# Patient Record
Sex: Female | Born: 1935 | Race: White | Hispanic: No | Smoking: Never smoker
Health system: Southern US, Community
[De-identification: ages and names within clinical notes are randomized; demographics above are authoritative.]

## PROBLEM LIST (undated history)

## (undated) DIAGNOSIS — L409 Psoriasis, unspecified: Secondary | ICD-10-CM

## (undated) DIAGNOSIS — Z860100 Personal history of colon polyps, unspecified: Secondary | ICD-10-CM

## (undated) DIAGNOSIS — M8580 Other specified disorders of bone density and structure, unspecified site: Secondary | ICD-10-CM

## (undated) DIAGNOSIS — K649 Unspecified hemorrhoids: Secondary | ICD-10-CM

## (undated) DIAGNOSIS — R350 Frequency of micturition: Secondary | ICD-10-CM

## (undated) DIAGNOSIS — Z8719 Personal history of other diseases of the digestive system: Secondary | ICD-10-CM

## (undated) DIAGNOSIS — D509 Iron deficiency anemia, unspecified: Secondary | ICD-10-CM

## (undated) DIAGNOSIS — C44300 Unspecified malignant neoplasm of skin of unspecified part of face: Secondary | ICD-10-CM

## (undated) DIAGNOSIS — M1990 Unspecified osteoarthritis, unspecified site: Secondary | ICD-10-CM

## (undated) DIAGNOSIS — M2550 Pain in unspecified joint: Secondary | ICD-10-CM

## (undated) DIAGNOSIS — I10 Essential (primary) hypertension: Secondary | ICD-10-CM

## (undated) DIAGNOSIS — Z9289 Personal history of other medical treatment: Secondary | ICD-10-CM

## (undated) DIAGNOSIS — J90 Pleural effusion, not elsewhere classified: Secondary | ICD-10-CM

## (undated) DIAGNOSIS — H269 Unspecified cataract: Secondary | ICD-10-CM

## (undated) DIAGNOSIS — E785 Hyperlipidemia, unspecified: Secondary | ICD-10-CM

## (undated) HISTORY — PX: BREAST BIOPSY: SHX20

## (undated) HISTORY — PX: TONSILLECTOMY: SUR1361

## (undated) HISTORY — PX: SKIN CANCER EXCISION: SHX779

## (undated) HISTORY — PX: VEIN LIGATION AND STRIPPING: SHX2653

## (undated) HISTORY — PX: COLONOSCOPY: SHX174

## (undated) HISTORY — PX: CATARACT EXTRACTION W/ INTRAOCULAR LENS IMPLANT: SHX1309

---

## 1975-12-17 HISTORY — PX: TUBAL LIGATION: SHX77

## 1989-08-16 DIAGNOSIS — D509 Iron deficiency anemia, unspecified: Secondary | ICD-10-CM

## 1989-08-16 DIAGNOSIS — Z9289 Personal history of other medical treatment: Secondary | ICD-10-CM

## 1989-08-16 HISTORY — DX: Iron deficiency anemia, unspecified: D50.9

## 1989-08-16 HISTORY — DX: Personal history of other medical treatment: Z92.89

## 1998-10-30 ENCOUNTER — Ambulatory Visit (HOSPITAL_COMMUNITY): Admission: RE | Admit: 1998-10-30 | Discharge: 1998-10-30 | Payer: Self-pay | Admitting: Obstetrics & Gynecology

## 1998-11-29 ENCOUNTER — Encounter (HOSPITAL_COMMUNITY): Admission: RE | Admit: 1998-11-29 | Discharge: 1999-02-27 | Payer: Self-pay | Admitting: Internal Medicine

## 1998-12-25 ENCOUNTER — Other Ambulatory Visit: Admission: RE | Admit: 1998-12-25 | Discharge: 1998-12-25 | Payer: Self-pay | Admitting: *Deleted

## 1999-10-25 ENCOUNTER — Encounter: Admission: RE | Admit: 1999-10-25 | Discharge: 1999-10-25 | Payer: Self-pay | Admitting: Internal Medicine

## 1999-10-25 ENCOUNTER — Encounter: Payer: Self-pay | Admitting: Internal Medicine

## 2000-03-04 ENCOUNTER — Other Ambulatory Visit: Admission: RE | Admit: 2000-03-04 | Discharge: 2000-03-04 | Payer: Self-pay | Admitting: *Deleted

## 2000-03-26 ENCOUNTER — Encounter: Admission: RE | Admit: 2000-03-26 | Discharge: 2000-03-26 | Payer: Self-pay | Admitting: Internal Medicine

## 2000-03-26 ENCOUNTER — Encounter: Payer: Self-pay | Admitting: Internal Medicine

## 2001-03-30 ENCOUNTER — Encounter: Payer: Self-pay | Admitting: Internal Medicine

## 2001-03-30 ENCOUNTER — Encounter: Admission: RE | Admit: 2001-03-30 | Discharge: 2001-03-30 | Payer: Self-pay | Admitting: Internal Medicine

## 2001-04-14 ENCOUNTER — Other Ambulatory Visit: Admission: RE | Admit: 2001-04-14 | Discharge: 2001-04-14 | Payer: Self-pay | Admitting: *Deleted

## 2001-05-04 ENCOUNTER — Encounter: Admission: RE | Admit: 2001-05-04 | Discharge: 2001-05-04 | Payer: Self-pay | Admitting: Internal Medicine

## 2001-05-04 ENCOUNTER — Encounter: Payer: Self-pay | Admitting: Internal Medicine

## 2002-02-24 ENCOUNTER — Ambulatory Visit (HOSPITAL_COMMUNITY): Admission: RE | Admit: 2002-02-24 | Discharge: 2002-02-24 | Payer: Self-pay | Admitting: Gastroenterology

## 2002-04-01 ENCOUNTER — Encounter: Admission: RE | Admit: 2002-04-01 | Discharge: 2002-04-01 | Payer: Self-pay | Admitting: Internal Medicine

## 2002-04-01 ENCOUNTER — Encounter: Payer: Self-pay | Admitting: Internal Medicine

## 2002-04-20 ENCOUNTER — Other Ambulatory Visit: Admission: RE | Admit: 2002-04-20 | Discharge: 2002-04-20 | Payer: Self-pay | Admitting: *Deleted

## 2003-04-13 ENCOUNTER — Encounter: Payer: Self-pay | Admitting: Internal Medicine

## 2003-04-13 ENCOUNTER — Ambulatory Visit (HOSPITAL_COMMUNITY): Admission: RE | Admit: 2003-04-13 | Discharge: 2003-04-13 | Payer: Self-pay | Admitting: Internal Medicine

## 2003-04-26 ENCOUNTER — Other Ambulatory Visit: Admission: RE | Admit: 2003-04-26 | Discharge: 2003-04-26 | Payer: Self-pay | Admitting: *Deleted

## 2003-12-17 HISTORY — PX: HERNIA REPAIR: SHX51

## 2004-02-29 ENCOUNTER — Ambulatory Visit (HOSPITAL_COMMUNITY): Admission: RE | Admit: 2004-02-29 | Discharge: 2004-02-29 | Payer: Self-pay | Admitting: Specialist

## 2004-03-12 ENCOUNTER — Encounter: Admission: RE | Admit: 2004-03-12 | Discharge: 2004-03-12 | Payer: Self-pay | Admitting: General Surgery

## 2004-03-20 ENCOUNTER — Ambulatory Visit (HOSPITAL_COMMUNITY): Admission: RE | Admit: 2004-03-20 | Discharge: 2004-03-21 | Payer: Self-pay | Admitting: General Surgery

## 2004-05-18 ENCOUNTER — Ambulatory Visit (HOSPITAL_COMMUNITY): Admission: RE | Admit: 2004-05-18 | Discharge: 2004-05-18 | Payer: Self-pay | Admitting: Internal Medicine

## 2004-06-11 ENCOUNTER — Other Ambulatory Visit: Admission: RE | Admit: 2004-06-11 | Discharge: 2004-06-11 | Payer: Self-pay | Admitting: *Deleted

## 2005-05-20 ENCOUNTER — Ambulatory Visit (HOSPITAL_COMMUNITY): Admission: RE | Admit: 2005-05-20 | Discharge: 2005-05-20 | Payer: Self-pay | Admitting: Internal Medicine

## 2006-05-21 ENCOUNTER — Ambulatory Visit (HOSPITAL_COMMUNITY): Admission: RE | Admit: 2006-05-21 | Discharge: 2006-05-21 | Payer: Self-pay | Admitting: Internal Medicine

## 2007-01-15 ENCOUNTER — Encounter: Admission: RE | Admit: 2007-01-15 | Discharge: 2007-01-15 | Payer: Self-pay | Admitting: Orthopedic Surgery

## 2007-05-25 ENCOUNTER — Ambulatory Visit (HOSPITAL_COMMUNITY): Admission: RE | Admit: 2007-05-25 | Discharge: 2007-05-25 | Payer: Self-pay | Admitting: Internal Medicine

## 2008-05-25 ENCOUNTER — Ambulatory Visit (HOSPITAL_COMMUNITY): Admission: RE | Admit: 2008-05-25 | Discharge: 2008-05-25 | Payer: Self-pay | Admitting: Internal Medicine

## 2009-05-29 ENCOUNTER — Ambulatory Visit (HOSPITAL_COMMUNITY): Admission: RE | Admit: 2009-05-29 | Discharge: 2009-05-29 | Payer: Self-pay | Admitting: Internal Medicine

## 2010-06-06 ENCOUNTER — Ambulatory Visit (HOSPITAL_COMMUNITY): Admission: RE | Admit: 2010-06-06 | Discharge: 2010-06-06 | Payer: Self-pay | Admitting: Internal Medicine

## 2010-12-31 ENCOUNTER — Encounter
Admission: RE | Admit: 2010-12-31 | Discharge: 2010-12-31 | Payer: Self-pay | Source: Home / Self Care | Attending: Internal Medicine | Admitting: Internal Medicine

## 2011-05-03 NOTE — Op Note (Signed)
NAME:  Christina Levy, Christina Levy                      ACCOUNT NO.:  [id number]   MEDICAL RECORD NO.:  [id number]                   PATIENT TYPE:  OIB   LOCATION:  5741                                 FACILITY:  [facility]   PHYSICIAN:  Gita Kudo, M.D.              DATE OF BIRTH:  [birth year]   DATE OF PROCEDURE:  03/20/2004  DATE OF DISCHARGE:  03/21/2004                                 OPERATIVE REPORT   PREOPERATIVE DIAGNOSIS:  Right lower abdominal wall hernia.   POSTOPERATIVE DIAGNOSIS:  Right lower abdominal wall hernia, very large  direct.   OPERATIVE PROCEDURE:  Repair right abdominal wall hernia, with Kugel two-  layer mesh.   SURGEON:  Gita Kudo, M.D.   ANESTHESIA:  General.   CLINICAL SUMMARY:  A 76 year old female, had left inguinal hernia repaired  by me several years ago and now has a bulge in her right lower abdomen.  Physical examination disclosed a mass and a CT scan obtained to make sure it  was not intra-abdominal showed a large lateral abdominal-pelvic wall hernia.   OPERATIVE FINDINGS:  The patient had a total blow-out of her right lower  quadrant abdominal wall.  The origin was actually at the internal ring.   OPERATIVE PROCEDURE:  Under satisfactory general endotracheal anesthesia,  having received 1.0 g Ancef preop, the patient's abdomen was prepped and  draped in a standard fashion.  A total of 30 mL of 0.5% Marcaine with  epinephrine was infiltrated throughout the procedure for postop analgesia.  Transverse lower abdominal incision was made and carried down through the  subcu fat.  Immediately a bulge was evident, but it was underneath the  external oblique and therefore the external oblique was opened widely down  to the external ring.  Then gentle finger dissection was used to free the  large herniated sac with encased bowel.  In so doing I was able to gently  free it all around to healthy margins.  The hernia actually consisted of  bowel contents  in a sac that was not entered.  It was easily reducible.  The  fascial defect was a large one that started at the internal ring and  extended medially.  After the margins of the hernia sac were identified, the  distal attachments of the round ligament were divided between clamps and  ties of 3-0 Vicryl.  Then the hernia sac was reduced and finger dissection  used to develop a plane in the preperitoneal space.  The hernia contents  were held away with a moistened gauze while the circular portion of a Kugel  mesh two-piece repair was placed.  It was anchored at Cooper's ligament with  a 0 Prolene suture and then introduced into the preperitoneal space and  deployed.  The moistened gauze was removed and the mesh lay in good  position.  Additional sutures of 0 Prolene were used to tack the mesh to the  undersurface of the abdominal wall.  When completed, the mesh very nicely  filled this space and extended from the pubis out to the internal ring.  Then the floor of the canal was closed over the mesh using a running 0  Prolene suture, which took intermittent bites of the mesh itself.  At the  internal ring, when tied, the ring was closed totally and the ends of the  suture left long.  Then the oval piece of the mesh was used without trimming  it at all.  It was anchored at the internal ring with the previous suture  and tacked around the periphery with several interrupted sutures to the  internal oblique above and the inguinal ligament below.  The wound was then  lavaged with saline and closed in layers with running 2-0 Vicryl for  external  oblique, interrupted 2-0 Vicryl for deep fascia, 3-0 Vicryl for subcu, Steri-  Strips for skin.  Sterile absorbent dressings were then applied and the  patient had an uneventful recovery from anesthesia and to the recovery room  in good condition.                                               Gita Kudo, M.D.    MRL/MEDQ  D:  03/20/2004  T:   03/21/2004  Job:  [id number]   cc:   Thora Lance, M.D.  [address]  [place]  [place] [address]  Fax: [phone]

## 2011-05-03 NOTE — Procedures (Signed)
[facility]  Patient:    Christina Levy, Christina Levy Visit Number: [id number] MRN: [id number]          Service Type: Attending:  Verlin Grills, M.D. Dictated by:   Verlin Grills, M.D. Proc. Date: 02/24/02   CC:         Thora Lance, M.D.   Procedure Report  PROCEDURE:  Screening colonoscopy.  REFERRING PHYSICIAN:  Thora Lance, M.D.  PROCEDURE INDICATION:  Christina Levy is a 77 year old female born 1934-05-17. She has a history of neoplastic but noncancerous colon polyps removed colonoscopically in 1991. Her followup colonoscopy five years ago was normal. She is scheduled today for screening colonoscopy with polypectomy to prevent colon cancer.  ENDOSCOPIST:  Verlin Grills, M.D.  PREMEDICATION:  Versed 10 mg, Demerol 50 mg.  ENDOSCOPE:  Olympus Pediatric colonoscope.  PROCEDURE:  After obtaining informed consent, Ms. Thackston was placed in the left lateral decubitus position. I administered intravenous Demerol and intravenous Versed to achieve conscious sedation for the procedure. The patients blood pressure, oxygen saturation, and cardiac rhythm were monitored throughout the procedure and documented in the medical record.  Anal inspection was normal. Digital rectal exam was normal. The Olympus Pediatric video colonoscope was introduced into the rectum and advanced to the cecum. Colonic preparation for the exam today was excellent.  Rectum:  Normal.  Sigmoid colon and descending colon:  Normal.  Splenic flexure:  Normal.  Transverse colon:  Normal.  Hepatic flexure:  Normal.  Ascending colon:  Normal.  Cecum and ileocecal valve:  Normal.  ASSESSMENT:  Normal proctocolonoscopy to the cecum. No endoscopic evidence for the presence of recurrent colorectal neoplasia.  RECOMMENDATIONS:  Repeat colonoscopy in five years. Dictated by:   Verlin Grills, M.D. Attending:  Verlin Grills, M.D. DD:   02/24/02 TD:  02/24/02 Job: [id number]

## 2011-05-20 ENCOUNTER — Other Ambulatory Visit (HOSPITAL_COMMUNITY): Payer: Self-pay | Admitting: Internal Medicine

## 2011-05-20 DIAGNOSIS — Z1231 Encounter for screening mammogram for malignant neoplasm of breast: Secondary | ICD-10-CM

## 2011-06-05 ENCOUNTER — Other Ambulatory Visit: Payer: Self-pay | Admitting: Internal Medicine

## 2011-06-05 DIAGNOSIS — R609 Edema, unspecified: Secondary | ICD-10-CM

## 2011-06-05 DIAGNOSIS — R52 Pain, unspecified: Secondary | ICD-10-CM

## 2011-06-06 ENCOUNTER — Ambulatory Visit
Admission: RE | Admit: 2011-06-06 | Discharge: 2011-06-06 | Disposition: A | Discharge status: Home / Self Care | Payer: Medicare Other | Source: Ambulatory Visit | Attending: Internal Medicine | Admitting: Internal Medicine

## 2011-06-06 DIAGNOSIS — R52 Pain, unspecified: Secondary | ICD-10-CM

## 2011-06-06 DIAGNOSIS — R609 Edema, unspecified: Secondary | ICD-10-CM

## 2011-06-10 ENCOUNTER — Ambulatory Visit (HOSPITAL_COMMUNITY)
Admission: RE | Admit: 2011-06-10 | Discharge: 2011-06-10 | Disposition: A | Discharge status: Home / Self Care | Payer: Medicare Other | Source: Ambulatory Visit | Attending: Internal Medicine | Admitting: Internal Medicine

## 2011-06-10 DIAGNOSIS — Z1231 Encounter for screening mammogram for malignant neoplasm of breast: Secondary | ICD-10-CM | POA: Insufficient documentation

## 2011-06-12 ENCOUNTER — Other Ambulatory Visit: Payer: Self-pay | Admitting: Internal Medicine

## 2011-06-12 DIAGNOSIS — R928 Other abnormal and inconclusive findings on diagnostic imaging of breast: Secondary | ICD-10-CM

## 2011-06-25 ENCOUNTER — Other Ambulatory Visit: Payer: Self-pay | Admitting: Internal Medicine

## 2011-06-25 ENCOUNTER — Ambulatory Visit
Admission: RE | Admit: 2011-06-25 | Discharge: 2011-06-25 | Disposition: A | Discharge status: Home / Self Care | Payer: Medicare Other | Source: Ambulatory Visit | Attending: Internal Medicine | Admitting: Internal Medicine

## 2011-06-25 ENCOUNTER — Other Ambulatory Visit: Payer: Self-pay | Admitting: Diagnostic Radiology

## 2011-06-25 DIAGNOSIS — R928 Other abnormal and inconclusive findings on diagnostic imaging of breast: Secondary | ICD-10-CM

## 2011-12-18 DIAGNOSIS — M542 Cervicalgia: Secondary | ICD-10-CM | POA: Diagnosis not present

## 2011-12-19 DIAGNOSIS — M542 Cervicalgia: Secondary | ICD-10-CM | POA: Diagnosis not present

## 2011-12-19 DIAGNOSIS — R259 Unspecified abnormal involuntary movements: Secondary | ICD-10-CM | POA: Diagnosis not present

## 2011-12-19 DIAGNOSIS — R51 Headache: Secondary | ICD-10-CM | POA: Diagnosis not present

## 2011-12-20 DIAGNOSIS — M542 Cervicalgia: Secondary | ICD-10-CM | POA: Diagnosis not present

## 2011-12-26 DIAGNOSIS — M19019 Primary osteoarthritis, unspecified shoulder: Secondary | ICD-10-CM | POA: Diagnosis not present

## 2011-12-26 DIAGNOSIS — M25519 Pain in unspecified shoulder: Secondary | ICD-10-CM | POA: Diagnosis not present

## 2012-01-10 DIAGNOSIS — M531 Cervicobrachial syndrome: Secondary | ICD-10-CM | POA: Diagnosis not present

## 2012-01-10 DIAGNOSIS — E785 Hyperlipidemia, unspecified: Secondary | ICD-10-CM | POA: Diagnosis not present

## 2012-01-10 DIAGNOSIS — R7301 Impaired fasting glucose: Secondary | ICD-10-CM | POA: Diagnosis not present

## 2012-01-10 DIAGNOSIS — Z1331 Encounter for screening for depression: Secondary | ICD-10-CM | POA: Diagnosis not present

## 2012-01-10 DIAGNOSIS — I10 Essential (primary) hypertension: Secondary | ICD-10-CM | POA: Diagnosis not present

## 2012-01-13 DIAGNOSIS — Z09 Encounter for follow-up examination after completed treatment for conditions other than malignant neoplasm: Secondary | ICD-10-CM | POA: Diagnosis not present

## 2012-01-13 DIAGNOSIS — Z86010 Personal history of colonic polyps: Secondary | ICD-10-CM | POA: Diagnosis not present

## 2012-02-21 DIAGNOSIS — M531 Cervicobrachial syndrome: Secondary | ICD-10-CM | POA: Diagnosis not present

## 2012-04-06 ENCOUNTER — Other Ambulatory Visit: Payer: Self-pay | Admitting: Internal Medicine

## 2012-04-06 DIAGNOSIS — N6039 Fibrosclerosis of unspecified breast: Secondary | ICD-10-CM

## 2012-04-09 DIAGNOSIS — Z79899 Other long term (current) drug therapy: Secondary | ICD-10-CM | POA: Diagnosis not present

## 2012-04-09 DIAGNOSIS — L408 Other psoriasis: Secondary | ICD-10-CM | POA: Diagnosis not present

## 2012-04-15 ENCOUNTER — Ambulatory Visit
Admission: RE | Admit: 2012-04-15 | Discharge: 2012-04-15 | Disposition: A | Discharge status: Home / Self Care | Payer: Medicare Other | Source: Ambulatory Visit | Attending: Internal Medicine | Admitting: Internal Medicine

## 2012-04-15 DIAGNOSIS — N6489 Other specified disorders of breast: Secondary | ICD-10-CM | POA: Diagnosis not present

## 2012-04-15 DIAGNOSIS — N6039 Fibrosclerosis of unspecified breast: Secondary | ICD-10-CM

## 2012-05-08 DIAGNOSIS — M19019 Primary osteoarthritis, unspecified shoulder: Secondary | ICD-10-CM | POA: Diagnosis not present

## 2012-06-22 ENCOUNTER — Other Ambulatory Visit: Payer: Self-pay | Admitting: Internal Medicine

## 2012-06-22 DIAGNOSIS — N649 Disorder of breast, unspecified: Secondary | ICD-10-CM

## 2012-07-01 ENCOUNTER — Ambulatory Visit
Admission: RE | Admit: 2012-07-01 | Discharge: 2012-07-01 | Disposition: A | Discharge status: Home / Self Care | Payer: Medicare Other | Source: Ambulatory Visit | Attending: Internal Medicine | Admitting: Internal Medicine

## 2012-07-01 DIAGNOSIS — N649 Disorder of breast, unspecified: Secondary | ICD-10-CM

## 2012-07-01 DIAGNOSIS — R928 Other abnormal and inconclusive findings on diagnostic imaging of breast: Secondary | ICD-10-CM | POA: Diagnosis not present

## 2012-07-09 DIAGNOSIS — I10 Essential (primary) hypertension: Secondary | ICD-10-CM | POA: Diagnosis not present

## 2012-07-09 DIAGNOSIS — M531 Cervicobrachial syndrome: Secondary | ICD-10-CM | POA: Diagnosis not present

## 2012-08-08 DIAGNOSIS — M19019 Primary osteoarthritis, unspecified shoulder: Secondary | ICD-10-CM | POA: Diagnosis not present

## 2012-08-08 DIAGNOSIS — M25559 Pain in unspecified hip: Secondary | ICD-10-CM | POA: Diagnosis not present

## 2012-08-08 DIAGNOSIS — M6740 Ganglion, unspecified site: Secondary | ICD-10-CM | POA: Diagnosis not present

## 2012-08-12 DIAGNOSIS — L408 Other psoriasis: Secondary | ICD-10-CM | POA: Diagnosis not present

## 2012-08-12 DIAGNOSIS — L570 Actinic keratosis: Secondary | ICD-10-CM | POA: Diagnosis not present

## 2012-08-12 DIAGNOSIS — L600 Ingrowing nail: Secondary | ICD-10-CM | POA: Diagnosis not present

## 2012-08-12 DIAGNOSIS — D2370 Other benign neoplasm of skin of unspecified lower limb, including hip: Secondary | ICD-10-CM | POA: Diagnosis not present

## 2012-08-12 DIAGNOSIS — Z79899 Other long term (current) drug therapy: Secondary | ICD-10-CM | POA: Diagnosis not present

## 2012-08-20 DIAGNOSIS — M19019 Primary osteoarthritis, unspecified shoulder: Secondary | ICD-10-CM | POA: Diagnosis not present

## 2012-08-27 DIAGNOSIS — M19019 Primary osteoarthritis, unspecified shoulder: Secondary | ICD-10-CM | POA: Diagnosis not present

## 2012-08-31 ENCOUNTER — Other Ambulatory Visit: Payer: Self-pay | Admitting: Orthopedic Surgery

## 2012-09-14 ENCOUNTER — Encounter (HOSPITAL_BASED_OUTPATIENT_CLINIC_OR_DEPARTMENT_OTHER): Payer: Self-pay | Admitting: *Deleted

## 2012-09-14 NOTE — Progress Notes (Signed)
To come in for [id number]  

## 2012-09-15 ENCOUNTER — Encounter (HOSPITAL_BASED_OUTPATIENT_CLINIC_OR_DEPARTMENT_OTHER)
Admission: RE | Admit: 2012-09-15 | Discharge: 2012-09-15 | Disposition: A | Discharge status: Home / Self Care | Payer: Medicare Other | Source: Ambulatory Visit | Attending: Orthopedic Surgery | Admitting: Orthopedic Surgery

## 2012-09-15 DIAGNOSIS — M19019 Primary osteoarthritis, unspecified shoulder: Secondary | ICD-10-CM | POA: Diagnosis not present

## 2012-09-15 DIAGNOSIS — J90 Pleural effusion, not elsewhere classified: Secondary | ICD-10-CM | POA: Diagnosis not present

## 2012-09-15 DIAGNOSIS — Z01812 Encounter for preprocedural laboratory examination: Secondary | ICD-10-CM | POA: Diagnosis not present

## 2012-09-15 DIAGNOSIS — M24119 Other articular cartilage disorders, unspecified shoulder: Secondary | ICD-10-CM | POA: Diagnosis not present

## 2012-09-15 DIAGNOSIS — K449 Diaphragmatic hernia without obstruction or gangrene: Secondary | ICD-10-CM | POA: Diagnosis not present

## 2012-09-15 DIAGNOSIS — R0602 Shortness of breath: Secondary | ICD-10-CM | POA: Diagnosis not present

## 2012-09-15 DIAGNOSIS — Z01810 Encounter for preprocedural cardiovascular examination: Secondary | ICD-10-CM | POA: Diagnosis not present

## 2012-09-15 HISTORY — DX: Pleural effusion, not elsewhere classified: J90

## 2012-09-15 HISTORY — PX: SHOULDER ARTHROSCOPY: SHX128

## 2012-09-15 LAB — BASIC METABOLIC PANEL
BUN: 15 mg/dL (ref 6–23)
Calcium: 9.7 mg/dL (ref 8.4–10.5)
GFR calc non Af Amer: 81 mL/min — ABNORMAL LOW (ref >90)
Glucose, Bld: 98 mg/dL (ref 70–99)
Potassium: 3.7 mEq/L (ref 3.5–5.1)

## 2012-09-16 ENCOUNTER — Encounter (HOSPITAL_COMMUNITY): Payer: Self-pay | Admitting: Adult Health

## 2012-09-16 ENCOUNTER — Encounter (HOSPITAL_BASED_OUTPATIENT_CLINIC_OR_DEPARTMENT_OTHER)
Admission: RE | Disposition: A | Discharge status: Home / Self Care | Payer: Self-pay | Source: Ambulatory Visit | Attending: Orthopedic Surgery

## 2012-09-16 ENCOUNTER — Encounter (HOSPITAL_BASED_OUTPATIENT_CLINIC_OR_DEPARTMENT_OTHER): Payer: Self-pay | Admitting: *Deleted

## 2012-09-16 ENCOUNTER — Ambulatory Visit (HOSPITAL_BASED_OUTPATIENT_CLINIC_OR_DEPARTMENT_OTHER)
Admission: RE | Admit: 2012-09-16 | Discharge: 2012-09-16 | Disposition: A | Discharge status: Home / Self Care | Payer: Medicare Other | Source: Ambulatory Visit | Attending: Orthopedic Surgery | Admitting: Orthopedic Surgery

## 2012-09-16 ENCOUNTER — Encounter (HOSPITAL_BASED_OUTPATIENT_CLINIC_OR_DEPARTMENT_OTHER): Payer: Self-pay | Admitting: Anesthesiology

## 2012-09-16 ENCOUNTER — Emergency Department (HOSPITAL_COMMUNITY)
Admission: EM | Admit: 2012-09-16 | Discharge: 2012-09-17 | Disposition: A | Discharge status: Home / Self Care | Payer: Medicare Other | Source: Home / Self Care | Attending: Emergency Medicine | Admitting: Emergency Medicine

## 2012-09-16 ENCOUNTER — Emergency Department (HOSPITAL_COMMUNITY): Payer: Medicare Other

## 2012-09-16 ENCOUNTER — Ambulatory Visit (HOSPITAL_BASED_OUTPATIENT_CLINIC_OR_DEPARTMENT_OTHER): Payer: Medicare Other | Admitting: Anesthesiology

## 2012-09-16 DIAGNOSIS — J90 Pleural effusion, not elsewhere classified: Secondary | ICD-10-CM | POA: Insufficient documentation

## 2012-09-16 DIAGNOSIS — Z01812 Encounter for preprocedural laboratory examination: Secondary | ICD-10-CM | POA: Diagnosis not present

## 2012-09-16 DIAGNOSIS — M66329 Spontaneous rupture of flexor tendons, unspecified upper arm: Secondary | ICD-10-CM | POA: Diagnosis not present

## 2012-09-16 DIAGNOSIS — S43439A Superior glenoid labrum lesion of unspecified shoulder, initial encounter: Secondary | ICD-10-CM | POA: Diagnosis not present

## 2012-09-16 DIAGNOSIS — R0602 Shortness of breath: Secondary | ICD-10-CM

## 2012-09-16 DIAGNOSIS — G8918 Other acute postprocedural pain: Secondary | ICD-10-CM | POA: Diagnosis not present

## 2012-09-16 DIAGNOSIS — Z880 Allergy status to penicillin: Secondary | ICD-10-CM | POA: Insufficient documentation

## 2012-09-16 DIAGNOSIS — M129 Arthropathy, unspecified: Secondary | ICD-10-CM | POA: Insufficient documentation

## 2012-09-16 DIAGNOSIS — E785 Hyperlipidemia, unspecified: Secondary | ICD-10-CM | POA: Insufficient documentation

## 2012-09-16 DIAGNOSIS — M25519 Pain in unspecified shoulder: Secondary | ICD-10-CM | POA: Diagnosis not present

## 2012-09-16 DIAGNOSIS — K449 Diaphragmatic hernia without obstruction or gangrene: Secondary | ICD-10-CM | POA: Insufficient documentation

## 2012-09-16 DIAGNOSIS — Z01810 Encounter for preprocedural cardiovascular examination: Secondary | ICD-10-CM | POA: Diagnosis not present

## 2012-09-16 DIAGNOSIS — M19019 Primary osteoarthritis, unspecified shoulder: Secondary | ICD-10-CM | POA: Insufficient documentation

## 2012-09-16 DIAGNOSIS — M24119 Other articular cartilage disorders, unspecified shoulder: Secondary | ICD-10-CM | POA: Insufficient documentation

## 2012-09-16 DIAGNOSIS — Z79899 Other long term (current) drug therapy: Secondary | ICD-10-CM | POA: Insufficient documentation

## 2012-09-16 DIAGNOSIS — I10 Essential (primary) hypertension: Secondary | ICD-10-CM | POA: Insufficient documentation

## 2012-09-16 HISTORY — DX: Unspecified osteoarthritis, unspecified site: M19.90

## 2012-09-16 HISTORY — DX: Psoriasis, unspecified: L40.9

## 2012-09-16 HISTORY — DX: Essential (primary) hypertension: I10

## 2012-09-16 HISTORY — DX: Hyperlipidemia, unspecified: E78.5

## 2012-09-16 LAB — BASIC METABOLIC PANEL
CO2: 26 mEq/L (ref 19–32)
Calcium: 9.6 mg/dL (ref 8.4–10.5)
Creatinine, Ser: 0.85 mg/dL (ref 0.50–1.10)

## 2012-09-16 LAB — CBC
MCH: 29.8 pg (ref 26.0–34.0)
MCHC: 33.7 g/dL (ref 30.0–36.0)
MCV: 88.3 fL (ref 78.0–100.0)
Platelets: 309 10*3/uL (ref 150–400)
RBC: 4.97 MIL/uL (ref 3.87–5.11)

## 2012-09-16 SURGERY — SHOULDER ARTHROSCOPY WITH BICEPS TENOTOMY
Anesthesia: General | Site: Shoulder | Laterality: Right | Wound class: Clean

## 2012-09-16 MED ORDER — SUCCINYLCHOLINE CHLORIDE 20 MG/ML IJ SOLN
INTRAMUSCULAR | Status: DC | PRN
  Administered 2012-09-16: 100 mg via INTRAVENOUS

## 2012-09-16 MED ORDER — MOXIFLOXACIN HCL 400 MG PO TABS: 400.0000 mg | ORAL_TABLET | Freq: Every day | ORAL | Status: DC

## 2012-09-16 MED ORDER — MOXIFLOXACIN HCL 400 MG PO TABS
400.0000 mg | ORAL_TABLET | Freq: Once | ORAL | Status: AC
  Administered 2012-09-17: 400 mg via ORAL
  Filled 2012-09-16: qty 1

## 2012-09-16 MED ORDER — BUPIVACAINE-EPINEPHRINE PF 0.5-1:200000 % IJ SOLN
INTRAMUSCULAR | Status: DC | PRN
  Administered 2012-09-16: 30 mL

## 2012-09-16 MED ORDER — FENTANYL CITRATE 0.05 MG/ML IJ SOLN
50.0000 ug | Freq: Once | INTRAMUSCULAR | Status: AC
  Administered 2012-09-16: 50 ug via INTRAVENOUS

## 2012-09-16 MED ORDER — HYDROCODONE-ACETAMINOPHEN 5-500 MG PO TABS: 1.0000 | ORAL_TABLET | Freq: Four times a day (QID) | ORAL | Status: DC | PRN

## 2012-09-16 MED ORDER — SODIUM CHLORIDE 0.9 % IR SOLN
Status: DC | PRN
  Administered 2012-09-16: 6000 mL

## 2012-09-16 MED ORDER — MIDAZOLAM HCL 2 MG/2ML IJ SOLN
1.0000 mg | INTRAMUSCULAR | Status: DC | PRN
  Administered 2012-09-16: 1 mg via INTRAVENOUS

## 2012-09-16 MED ORDER — CEFAZOLIN SODIUM-DEXTROSE 2-3 GM-% IV SOLR
2.0000 g | INTRAVENOUS | Status: AC
  Administered 2012-09-16: 2 g via INTRAVENOUS

## 2012-09-16 MED ORDER — MIDAZOLAM HCL 2 MG/2ML IJ SOLN: 0.5000 mg | Freq: Once | INTRAMUSCULAR | Status: DC | PRN

## 2012-09-16 MED ORDER — PROPOFOL 10 MG/ML IV BOLUS
INTRAVENOUS | Status: DC | PRN
  Administered 2012-09-16: 100 mg via INTRAVENOUS

## 2012-09-16 MED ORDER — HYDROMORPHONE HCL PF 1 MG/ML IJ SOLN: 0.2500 mg | INTRAMUSCULAR | Status: DC | PRN

## 2012-09-16 MED ORDER — FENTANYL CITRATE 0.05 MG/ML IJ SOLN
INTRAMUSCULAR | Status: DC | PRN
  Administered 2012-09-16: 100 ug via INTRAVENOUS

## 2012-09-16 MED ORDER — DEXAMETHASONE SODIUM PHOSPHATE 4 MG/ML IJ SOLN
INTRAMUSCULAR | Status: DC | PRN
  Administered 2012-09-16: 10 mg via INTRAVENOUS

## 2012-09-16 MED ORDER — LACTATED RINGERS IV SOLN
INTRAVENOUS | Status: DC
  Administered 2012-09-16 (×2): via INTRAVENOUS

## 2012-09-16 MED ORDER — PROMETHAZINE HCL 25 MG/ML IJ SOLN: 6.2500 mg | INTRAMUSCULAR | Status: DC | PRN

## 2012-09-16 MED ORDER — POVIDONE-IODINE 7.5 % EX SOLN: Freq: Once | CUTANEOUS | Status: DC

## 2012-09-16 MED ORDER — MEPERIDINE HCL 25 MG/ML IJ SOLN: 6.2500 mg | INTRAMUSCULAR | Status: DC | PRN

## 2012-09-16 SURGICAL SUPPLY — 72 items
BENZOIN TINCTURE PRP APPL 2/3 (GAUZE/BANDAGES/DRESSINGS) IMPLANT
BLADE SURG 15 STRL LF DISP TIS (BLADE) IMPLANT
BLADE SURG 15 STRL SS (BLADE)
BLADE VORTEX 6.0 (BLADE) IMPLANT
CANISTER OMNI JUG 16 LITER (MISCELLANEOUS) ×2 IMPLANT
CANISTER SUCTION 2500CC (MISCELLANEOUS) IMPLANT
CANNULA 5.75X71 LONG (CANNULA) IMPLANT
CANNULA TWIST IN 8.25X7CM (CANNULA) IMPLANT
CLOTH BEACON ORANGE TIMEOUT ST (SAFETY) ×2 IMPLANT
CUTTER MENISCUS  4.2MM (BLADE) ×1
CUTTER MENISCUS 4.2MM (BLADE) ×1 IMPLANT
DECANTER SPIKE VIAL GLASS SM (MISCELLANEOUS) IMPLANT
DRAPE INCISE IOBAN 66X45 STRL (DRAPES) ×2 IMPLANT
DRAPE STERI 35X30 U-POUCH (DRAPES) ×2 IMPLANT
DRAPE SURG 17X23 STRL (DRAPES) ×2 IMPLANT
DRAPE U-SHAPE 47X51 STRL (DRAPES) ×2 IMPLANT
DRAPE U-SHAPE 76X120 STRL (DRAPES) ×4 IMPLANT
DRSG EMULSION OIL 3X3 NADH (GAUZE/BANDAGES/DRESSINGS) ×2 IMPLANT
DRSG PAD ABDOMINAL 8X10 ST (GAUZE/BANDAGES/DRESSINGS) ×4 IMPLANT
DURAPREP 26ML APPLICATOR (WOUND CARE) ×2 IMPLANT
ELECT REM PT RETURN 9FT ADLT (ELECTROSURGICAL) ×2
ELECTRODE REM PT RTRN 9FT ADLT (ELECTROSURGICAL) ×1 IMPLANT
GLOVE BIO SURGEON STRL SZ 6.5 (GLOVE) ×2 IMPLANT
GLOVE BIOGEL PI IND STRL 7.0 (GLOVE) ×1 IMPLANT
GLOVE BIOGEL PI IND STRL 8 (GLOVE) ×2 IMPLANT
GLOVE BIOGEL PI INDICATOR 7.0 (GLOVE) ×1
GLOVE BIOGEL PI INDICATOR 8 (GLOVE) ×2
GLOVE ECLIPSE 7.5 STRL STRAW (GLOVE) ×4 IMPLANT
GOWN BRE IMP PREV XXLGXLNG (GOWN DISPOSABLE) ×2 IMPLANT
GOWN PREVENTION PLUS XLARGE (GOWN DISPOSABLE) ×2 IMPLANT
GOWN PREVENTION PLUS XXLARGE (GOWN DISPOSABLE) ×2 IMPLANT
NDL SUT 6 .5 CRC .975X.05 MAYO (NEEDLE) IMPLANT
NEEDLE 1/2 CIR CATGUT .05X1.09 (NEEDLE) IMPLANT
NEEDLE HYPO 18GX1.5 BLUNT FILL (NEEDLE) ×2 IMPLANT
NEEDLE MAYO TAPER (NEEDLE)
NEEDLE SCORPION MULTI FIRE (NEEDLE) IMPLANT
NS IRRIG 1000ML POUR BTL (IV SOLUTION) IMPLANT
PACK ARTHROSCOPY DSU (CUSTOM PROCEDURE TRAY) ×2 IMPLANT
PACK BASIN DAY SURGERY FS (CUSTOM PROCEDURE TRAY) ×2 IMPLANT
PASSER SUT SWANSON 36MM LOOP (INSTRUMENTS) IMPLANT
PENCIL BUTTON HOLSTER BLD 10FT (ELECTRODE) IMPLANT
SET IRRIG Y TYPE TUR BLADDER L (SET/KITS/TRAYS/PACK) ×2 IMPLANT
SLEEVE SCD COMPRESS KNEE MED (MISCELLANEOUS) ×2 IMPLANT
SLING ARM FOAM STRAP LRG (SOFTGOODS) IMPLANT
SLING ARM FOAM STRAP MED (SOFTGOODS) IMPLANT
SLING ARM FOAM STRAP XLG (SOFTGOODS) ×2 IMPLANT
SLING ARM IMMOBILIZER LRG (SOFTGOODS) IMPLANT
SPONGE GAUZE 4X4 12PLY (GAUZE/BANDAGES/DRESSINGS) ×2 IMPLANT
SPONGE LAP 4X18 X RAY DECT (DISPOSABLE) IMPLANT
STRIP CLOSURE SKIN 1/2X4 (GAUZE/BANDAGES/DRESSINGS) IMPLANT
SUCTION FRAZIER TIP 10 FR DISP (SUCTIONS) IMPLANT
SUT ETHIBOND 2 OS 4 DA (SUTURE) IMPLANT
SUT ETHILON 4 0 PS 2 18 (SUTURE) ×2 IMPLANT
SUT MNCRL AB 3-0 PS2 18 (SUTURE) IMPLANT
SUT PDS AB 0 CT 36 (SUTURE) IMPLANT
SUT PROLENE 3 0 PS 2 (SUTURE) IMPLANT
SUT TICRON 1 T 12 (SUTURE) IMPLANT
SUT TIGER TAPE 7 IN WHITE (SUTURE) IMPLANT
SUT VIC AB 0 CT1 27 (SUTURE)
SUT VIC AB 0 CT1 27XBRD ANBCTR (SUTURE) IMPLANT
SUT VIC AB 1 CT1 27 (SUTURE)
SUT VIC AB 1 CT1 27XBRD ANBCTR (SUTURE) IMPLANT
SUT VIC AB 2-0 SH 27 (SUTURE)
SUT VIC AB 2-0 SH 27XBRD (SUTURE) IMPLANT
SYR 5ML LL (SYRINGE) ×2 IMPLANT
TAPE FIBER 2MM 7IN #2 BLUE (SUTURE) IMPLANT
TOWEL OR 17X24 6PK STRL BLUE (TOWEL DISPOSABLE) ×2 IMPLANT
TOWEL OR NON WOVEN STRL DISP B (DISPOSABLE) ×2 IMPLANT
TUBE CONNECTING 20X1/4 (TUBING) IMPLANT
WAND STAR VAC 90 (SURGICAL WAND) ×2 IMPLANT
WATER STERILE IRR 1000ML POUR (IV SOLUTION) ×2 IMPLANT
YANKAUER SUCT BULB TIP NO VENT (SUCTIONS) IMPLANT

## 2012-09-16 NOTE — Transfer of Care (Signed)
Immediate Anesthesia Transfer of Care Note  Patient: Christina Levy  Procedure(s) Performed: Procedure(s) (LRB) with comments: SHOULDER ARTHROSCOPY WITH BICEPSTENOTOMY (Right) - right shoulder scope with biceps tenotomy and debridement of superior labral tear  Patient Location: PACU  Anesthesia Type: General  Level of Consciousness: sedated  Airway & Oxygen Therapy: Patient Spontanous Breathing and Patient connected to face mask oxygen  Post-op Assessment: Report given to PACU RN and Post -op Vital signs reviewed and stable  Post vital signs: Reviewed and stable  Complications: No apparent anesthesia complications

## 2012-09-16 NOTE — ED Notes (Addendum)
Shoulder surgery on right shoulder yesterday. Today began having SOB and diaphoresis. SAts on RA 93%, denies pain, has taken 2 pain pills tonight. Bilateral breath sounds clear.

## 2012-09-16 NOTE — Anesthesia Preprocedure Evaluation (Signed)
Anesthesia Evaluation  Patient identified by MRN, date of birth, ID band Patient awake    Reviewed: Allergy & Precautions, H&P , NPO status , Patient's Chart, lab work & pertinent test results  History of Anesthesia Complications Negative for: history of anesthetic complications  Airway Mallampati: I TM Distance: >3 FB Neck ROM: Full    Dental No notable dental hx. (+) Teeth Intact and Dental Advisory Given   Pulmonary neg pulmonary ROS,  breath sounds clear to auscultation  Pulmonary exam normal       Cardiovascular hypertension, Rhythm:Regular Rate:Normal     Neuro/Psych negative neurological ROS     GI/Hepatic negative GI ROS, Neg liver ROS,   Endo/Other  negative endocrine ROS  Renal/GU negative Renal ROS     Musculoskeletal  (+) Arthritis -, Osteoarthritis,    Abdominal   Peds  Hematology negative hematology ROS (+)   Anesthesia Other Findings   Reproductive/Obstetrics                           Anesthesia Physical Anesthesia Plan  ASA: II  Anesthesia Plan: General   Post-op Pain Management:    Induction: Intravenous  Airway Management Planned: Oral ETT  Additional Equipment:   Intra-op Plan:   Post-operative Plan: Extubation in OR  Informed Consent: I have reviewed the patients History and Physical, chart, labs and discussed the procedure including the risks, benefits and alternatives for the proposed anesthesia with the patient or authorized representative who has indicated his/her understanding and acceptance.   Dental advisory given  Plan Discussed with: CRNA and Surgeon  Anesthesia Plan Comments: (Plan routine monitors, GETA with interscalene block for post op analgesia)        Anesthesia Quick Evaluation

## 2012-09-16 NOTE — ED Provider Notes (Signed)
History     CSN: [id number]  Arrival date & time 09/16/12  2245   First MD Initiated Contact with Patient 09/16/12 2258      Chief Complaint  Patient presents with  . Shortness of Breath    (Consider location/radiation/quality/duration/timing/severity/associated sxs/prior treatment) HPI Comments: 78 year old female who presents with a complaint of shortness of breath and difficulty breathing. She has a history of a recent surgical procedure which is an orthopedic fluoroscopy of the right shoulder for a torn labrum as well as in the biceps tendon manipulation.  After the procedure she was in the postanesthesia care unit and was short of breath, she use the incentive spirometer and had some improvement with oxygen levels of 93%. She has had no coughing, no fever, no chest pain. She describes the shortness of breath and is having some difficulty "catching my breath". She states that this is unusual for her and she feels like she has to force herself to breathe every 10-15 seconds. There has been no leg swelling, no back pain, no history of pulmonary or cardiac diseases. She does have a history of a hiatal hernia which she is aware of. Nothing seems to make this better or worse, it is persistent over the last several hours.  Patient is a 78 y.o. female presenting with shortness of breath. The history is provided by the patient, a relative and medical records.  Shortness of Breath  Associated symptoms include shortness of breath. Pertinent negatives include no chest pain, no fever, no sore throat and no cough.    Past Medical History  Diagnosis Date  . Hypertension   . Arthritis   . Hyperlipemia   . Psoriasis     Past Surgical History  Procedure Date  . Tonsillectomy   . Hernia repair 2005    rt lower abd  . Colonoscopy     History reviewed. No pertinent family history.  History  Substance Use Topics  . Smoking status: Never Smoker   . Smokeless tobacco: Not on file  . Alcohol  Use: No    OB History    Grav Para Term Preterm Abortions TAB SAB Ect Mult Living                  Review of Systems  Constitutional: Negative for fever and chills.  HENT: Negative for sore throat and neck pain.   Eyes: Negative for visual disturbance.  Respiratory: Positive for shortness of breath. Negative for cough.   Cardiovascular: Negative for chest pain.  Gastrointestinal: Negative for nausea, vomiting, abdominal pain and diarrhea.  Genitourinary: Negative for dysuria and frequency.  Musculoskeletal: Negative for back pain.  Skin: Negative for rash.  Neurological: Negative for weakness, numbness and headaches.  Hematological: Negative for adenopathy.  Psychiatric/Behavioral: Negative for behavioral problems.    Allergies  Penicillins  Home Medications   Current Outpatient Rx  Name Route Sig Dispense Refill  . ACITRETIN 25 MG PO CAPS Oral Take 25 mg by mouth every other day.    . ATORVASTATIN CALCIUM 10 MG PO TABS Oral Take 10 mg by mouth daily.    Marland Kitchen CALCIUM CARBONATE 600 MG PO TABS Oral Take 600 mg by mouth 2 (two) times daily with a meal.    . COENZYME Q10 30 MG PO CAPS Oral Take 30 mg by mouth 3 (three) times daily.    Marland Kitchen GLUCOSAMINE-CHONDROITIN 500-400 MG PO TABS Oral Take 1 tablet by mouth 3 (three) times daily.    Marland Kitchen HYDROCODONE-ACETAMINOPHEN 5-500 MG  PO TABS Oral Take 1-2 tablets by mouth every 6 (six) hours as needed for pain. 40 tablet 0  . MAGNESIUM OXIDE 400 MG PO TABS Oral Take 400 mg by mouth daily.    Marland Kitchen MOXIFLOXACIN HCL 400 MG PO TABS Oral Take 1 tablet (400 mg total) by mouth daily. 7 tablet 0  . RALOXIFENE HCL 60 MG PO TABS Oral Take 60 mg by mouth daily.    . TRIAMTERENE-HCTZ 37.5-25 MG PO CAPS Oral Take 1 capsule by mouth every morning.      BP 177/80  Pulse 85  Temp 98 F (36.7 C) (Oral)  Resp 22  SpO2 93%  Physical Exam  Nursing note and vitals reviewed. Constitutional: She appears well-developed and well-nourished. No distress.  HENT:    Head: Normocephalic and atraumatic.  Mouth/Throat: Oropharynx is clear and moist. No oropharyngeal exudate.  Eyes: Conjunctivae normal and EOM are normal. Pupils are equal, round, and reactive to light. Right eye exhibits no discharge. Left eye exhibits no discharge. No scleral icterus.  Neck: Normal range of motion. Neck supple. No JVD present. No thyromegaly present.  Cardiovascular: Normal rate, regular rhythm, normal heart sounds and intact distal pulses.  Exam reveals no gallop and no friction rub.   No murmur heard. Pulmonary/Chest: Effort normal and breath sounds normal. No respiratory distress. She has no wheezes. She has no rales.       The lungs are very clear, her respiratory rate is approximately 18, she speaks in full sentences without any difficulty, she does occasionally take a forced deep breath.  Abdominal: Soft. Bowel sounds are normal. She exhibits no distension and no mass. There is no tenderness.  Musculoskeletal: She exhibits no edema and no tenderness.  Lymphadenopathy:    She has no cervical adenopathy.  Neurological: She is alert. Coordination normal.  Skin: Skin is warm and dry. No rash noted. No erythema.  Psychiatric: She has a normal mood and affect. Her behavior is normal.    ED Course  Procedures (including critical care time)  Labs Reviewed  BASIC METABOLIC PANEL - Abnormal; Notable for the following:    Glucose, Bld 273 (*)     GFR calc non Af Amer 65 (*)     GFR calc Af Amer 75 (*)     All other components within normal limits  CBC  POCT I-STAT TROPONIN I   Dg Chest 2 View  09/16/2012  *RADIOLOGY REPORT*  Clinical Data: Shortness of breath after shoulder surgery.  CHEST - 2 VIEW  Comparison: 03/19/2004  Findings: Two-view exam shows no evidence for pulmonary edema. There is a tiny right pleural effusion.  Massive hiatal hernia with air-fluid level noted.  This was present on the previous study as well and is not appear substantially changed.  Bones  are diffusely demineralized.  IMPRESSION: Tiny right pleural effusion.  Massive hiatal hernia, as before.   Original Report Authenticated By: ERIC A. MANSELL, M.D.      1. Shortness of breath   2. Hiatal hernia   3. Pleural effusion, right       MDM  Right upper extremity with normal neurovascular sensation and function, lungs are very clear, oxygenation is 97% on room air and on my evaluation during the exam. Her EKG is unremarkable without any signs of ischemia, tachycardia. It appears that she is having some difficulty with rest breathing however I would also consider that aspiration would be in the differential diagnosis.  I have personally interpreted the chest x-ray  and find her to be a very large left-sided hiatal hernia which is consistent with chest x-rays from several years ago which this was also displayed. She does have a very small right-sided pleural effusion in addition. There is no other focal consolidation or infiltrate, no pneumothorax.  Laboratory values do show normal blood counts, normal troponin, glucose is elevated at 273.  She had ice cream several hours prior to labs being drawn. She is not a diabetic,  ED ECG REPORT  I personally interpreted this EKG   Date: 09/16/2012   Rate: 86  Rhythm: normal sinus rhythm  QRS Axis: right  Intervals: normal  ST/T Wave abnormalities: normal  Conduction Disutrbances:none  Narrative Interpretation:   Old EKG Reviewed: Compared with 03/19/2004 there is a slight change in axis to a more neutral axis but no significant ST or T wave abnormalities  Findings discussed with the patient, at lock started, stable for discharge      Vida Roller, MD 09/17/12 [phone]

## 2012-09-16 NOTE — Progress Notes (Signed)
Assisted Dr. Jackson with right, interscalene  block. Side rails up, monitors on throughout procedure. See vital signs in flow sheet. Tolerated Procedure well. 

## 2012-09-16 NOTE — Anesthesia Procedure Notes (Addendum)
Anesthesia Regional Block:  Interscalene brachial plexus block  Pre-Anesthetic Checklist: ,, timeout performed, Correct Patient, Correct Site, Correct Laterality, Correct Procedure, Correct Position, site marked, Risks and benefits discussed,  Surgical consent,  Pre-op evaluation,  At surgeon's request and post-op pain management  Laterality: Right  Prep: chloraprep       Needles:  Injection technique: Single-shot  Needle Type: Stimulator Needle - 40     Needle Length: 4cm  Needle Gauge: 22 and 22 G    Additional Needles:  Procedures: nerve stimulator Interscalene brachial plexus block  Nerve Stimulator or Paresthesia:  Response: forearm twitch, 0.45 mA, 0.1 ms,   Additional Responses:   Narrative:  Start time: 09/16/2012 9:50 AM End time: 09/16/2012 9:58 AM Injection made incrementally with aspirations every 5 mL.  Performed by: Personally  Anesthesiologist: Sandford Craze, MD  Additional Notes: Pt identified in Holding room.  Monitors applied. Working IV access confirmed. Sterile prep R neck  #22ga PNS to forearm twitch at 0.60mA threshold.  30cc 0.5% Bupivacaine with 1:200k epi injected incrementally after negative test dose.  Patient asymptomatic, VSS, no heme aspirated, tolerated well.   Sandford Craze, MD  Interscalene brachial plexus block Procedure Name: Intubation Date/Time: 09/16/2012 10:42 AM Performed by: Burna Cash Pre-anesthesia Checklist: Patient identified, Emergency Drugs available, Suction available and Patient being monitored Patient Re-evaluated:Patient Re-evaluated prior to inductionOxygen Delivery Method: Circle System Utilized Preoxygenation: Pre-oxygenation with 100% oxygen Intubation Type: IV induction Ventilation: Mask ventilation without difficulty Laryngoscope Size: Mac and 3 Grade View: Grade I Tube type: Oral Tube size: 7.0 mm Number of attempts: 1 Airway Equipment and Method: stylet and oral airway Placement Confirmation: ETT inserted  through vocal cords under direct vision,  positive ETCO2 and breath sounds checked- equal and bilateral Secured at: 21 cm Tube secured with: Tape Dental Injury: Teeth and Oropharynx as per pre-operative assessment

## 2012-09-16 NOTE — Brief Op Note (Signed)
09/16/2012  11:27 AM  PATIENT:  Christina Levy  78 y.o. female  PRE-OPERATIVE DIAGNOSIS:  impingement right shoulder  POST-OPERATIVE DIAGNOSIS:  impingement right shoulder  PROCEDURE:  Procedure(s) (LRB) with comments: SHOULDER ARTHROSCOPY WITH BICEPSTENOTOMY (Right) - right shoulder scope with biceps tenotomy and debridement of superior labral tear  SURGEON:  Surgeon(s) and Role:    * Harvie Junior, MD - Primary  PHYSICIAN ASSISTANT:   ASSISTANTS: bethune   ANESTHESIA:   general  EBL:  Total I/O In: 1000 [I.V.:1000] Out: -   BLOOD ADMINISTERED:none  DRAINS: none   LOCAL MEDICATIONS USED:  MARCAINE     SPECIMEN:  No Specimen  DISPOSITION OF SPECIMEN:  N/A  COUNTS:  YES  TOURNIQUET:  * No tourniquets in log *  DICTATION: .Other Dictation: Dictation Number [id number]  PLAN OF CARE: home after pacu  PATIENT DISPOSITION:  PACU - hemodynamically stable.   Delay start of Pharmacological VTE agent (>24hrs) due to surgical blood loss or risk of bleeding: no

## 2012-09-16 NOTE — H&P (Signed)
PREOPERATIVE H&P  Chief Complaint: r shoulder pain  HPI: Christina Levy is a 78 y.o. female who presents for evaluation of r. Shoulder pain. It has been present for greater than 6 months and has been worsening. She has failed conservative measures. Pain is rated as moderate.  Past Medical History  Diagnosis Date  . Hypertension   . Arthritis   . Hyperlipemia   . Psoriasis    Past Surgical History  Procedure Date  . Tonsillectomy   . Hernia repair 2005    rt lower abd  . Colonoscopy    History   Social History  . Marital Status: Married    Spouse Name: N/A    Number of Children: N/A  . Years of Education: N/A   Social History Main Topics  . Smoking status: Never Smoker   . Smokeless tobacco: None  . Alcohol Use: No  . Drug Use: No  . Sexually Active:    Other Topics Concern  . None   Social History Narrative  . None   History reviewed. No pertinent family history. Allergies  Allergen Reactions  . Penicillins Hives   Prior to Admission medications   Medication Sig Start Date End Date Taking? Authorizing Provider  acitretin (SORIATANE) 25 MG capsule Take 25 mg by mouth every other day.   Yes Historical Provider, MD  atorvastatin (LIPITOR) 10 MG tablet Take 10 mg by mouth daily.   Yes Historical Provider, MD  calcium carbonate (OS-CAL) 600 MG TABS Take 600 mg by mouth 2 (two) times daily with a meal.   Yes Historical Provider, MD  co-enzyme Q-10 30 MG capsule Take 30 mg by mouth 3 (three) times daily.   Yes Historical Provider, MD  glucosamine-chondroitin 500-400 MG tablet Take 1 tablet by mouth 3 (three) times daily.   Yes Historical Provider, MD  magnesium oxide (MAG-OX) 400 MG tablet Take 400 mg by mouth daily.   Yes Historical Provider, MD  raloxifene (EVISTA) 60 MG tablet Take 60 mg by mouth daily.   Yes Historical Provider, MD  triamterene-hydrochlorothiazide (DYAZIDE) 37.5-25 MG per capsule Take 1 capsule by mouth every morning.   Yes Historical  Provider, MD     Positive ROS: none  All other systems have been reviewed and were otherwise negative with the exception of those mentioned in the HPI and as above.  Physical Exam: Filed Vitals:   09/16/12 0821  BP: 170/84  Pulse: 75  Temp: 98.1 F (36.7 C)  Resp: 18    General: Alert, no acute distress Cardiovascular: No pedal edema Respiratory: No cyanosis, no use of accessory musculature GI: No organomegaly, abdomen is soft and non-tender Skin: No lesions in the area of chief complaint Neurologic: Sensation intact distally Psychiatric: Patient is competent for consent with normal mood and affect Lymphatic: No axillary or cervical lymphadenopathy  MUSCULOSKELETAL: r shoulder: painful rom + ant shoulder pain  Assessment/Plan: impingement right shoulder Plan for Procedure(s): SHOULDER ARTHROSCOPY WITH BICEPS TENOTOMY and other as needed  The risks benefits and alternatives were discussed with the patient including but not limited to the risks of nonoperative treatment, versus surgical intervention including infection, bleeding, nerve injury, malunion, nonunion, hardware prominence, hardware failure, need for hardware removal, blood clots, cardiopulmonary complications, morbidity, mortality, among others, and they were willing to proceed.  Predicted outcome is good, although there will be at least a six to nine month expected recovery.  Albino Bufford L, MD 09/16/2012 10:19 AM

## 2012-09-16 NOTE — Anesthesia Postprocedure Evaluation (Signed)
  Anesthesia Post-op Note  Patient: Christina Levy  Procedure(s) Performed: Procedure(s) (LRB) with comments: SHOULDER ARTHROSCOPY WITH BICEPSTENOTOMY (Right) - right shoulder scope with biceps tenotomy and debridement of superior labral tear  Patient Location: PACU  Anesthesia Type: GA combined with regional for post-op pain  Level of Consciousness: awake, alert , oriented and patient cooperative  Airway and Oxygen Therapy: Patient Spontanous Breathing  Post-op Pain: none  Post-op Assessment: Post-op Vital signs reviewed, Patient's Cardiovascular Status Stable, Respiratory Function Stable, Patent Airway, No signs of Nausea or vomiting and Pain level controlled  Post-op Vital Signs: Reviewed and stable  Complications: No apparent anesthesia complications

## 2012-09-16 NOTE — ED Notes (Signed)
Patient transported to X-ray via wheelchair before placing her in the room.

## 2012-09-17 NOTE — Op Note (Signed)
Christina Levy, Christina Levy NO.:  [id number]  MEDICAL RECORD NO.:  [id number]  LOCATION:  A02C                         FACILITY:  [facility]  PHYSICIAN:  Harvie Junior, M.D.   DATE OF BIRTH:  Aug 03, 1934  DATE OF PROCEDURE:  09/16/2012 DATE OF DISCHARGE:  09/17/2012                              OPERATIVE REPORT   PREOPERATIVE DIAGNOSIS:  Superior labral tear with impending biceps tendon rupture with known glenohumeral arthritis.  PREOPERATIVE DIAGNOSIS:  Superior labral tear with impending biceps tendon rupture with known glenohumeral arthritis.  PROCEDURE: 1. Right shoulder arthroscopy with superior labral debridement     anterior posterior. 2. Biceps tenotomy right shoulder.  SURGEON:  Harvie Junior, M.D.  ASSISTANT:  Marshia Ly, P.A.  ANESTHESIA:  General.  BRIEF HISTORY:  Christina Levy is a 78 year old female with history of previous right shoulder arthroscopy with debridement and acromioplasty distal clavicle resection.  She did well for a long period of time.  But has been having worsening pain over the last several months.  We talked about the possibility of total shoulder replacement but x-rays really did show glenohumeral arthritis and we felt that it may be reasonable to think about arthroscopy with potential of debridement of the superior labrum and biceps tendon.  Given that she had these findings on MRI which showed labral pathology and biceps tendon impending tear.  She was brought to the operating room for this procedure.  After long discussion with she and her family about the possibility of this really being an arthritic issue which means that she may not be helped that much by the scope but they felt that was reasonable to try this minimal invasive procedure, and I agreed.  She was brought to the operating room for this procedure.  PROCEDURE:  The patient was brought to the operating room and after adequate anesthesia was obtained with  general anesthetic, the patient was placed supine on the operating table.  She was moved to beach chair position.  All bony prominences were well padded.  Attention turned to the right shoulder.  After routine prep and drape, shoulder arthroscopy was performed, and it showed that there was significant degenerative change within the glenohumeral joint, with full-thickness issues on the superior labrum and the superior portion of the glenoid and on the humeral head.  These were debrided back to a smooth and stable rim.  The biceps tendon appeared to have issues with pulling into the joint.  This was released, and the superior labrum really labrum from 360 degrees was debrided back to a smooth and stable rim.  Once this was completed, attention was turned to the rotator cuff undersurface which had some evidence of frayed but no evidence of full-thickness tear.  Attention turned out of the glenohumeral joint into the subacromial space, a subtotal bursectomy was performed.  The rotator cuff from the top side had certainly some areas of fraying, but there was no full-thickness component.  At this point, the shoulder was copiously and thoroughly lavaged, suctioned dry.  The portals were closed with bandages and stitch in the lateral portal and the patient was then taken to recovery room and was noted to be in satisfactory  condition. Estimated blood loss for procedure was none.     Harvie Junior, M.D.     Ranae Plumber  D:  09/16/2012  T:  09/17/2012  Job:  [id number]

## 2012-09-22 ENCOUNTER — Encounter (HOSPITAL_COMMUNITY): Payer: Self-pay

## 2012-09-25 DIAGNOSIS — M66329 Spontaneous rupture of flexor tendons, unspecified upper arm: Secondary | ICD-10-CM | POA: Diagnosis not present

## 2012-09-25 DIAGNOSIS — S43439A Superior glenoid labrum lesion of unspecified shoulder, initial encounter: Secondary | ICD-10-CM | POA: Diagnosis not present

## 2012-09-28 DIAGNOSIS — M66329 Spontaneous rupture of flexor tendons, unspecified upper arm: Secondary | ICD-10-CM | POA: Diagnosis not present

## 2012-10-02 DIAGNOSIS — S43439A Superior glenoid labrum lesion of unspecified shoulder, initial encounter: Secondary | ICD-10-CM | POA: Diagnosis not present

## 2012-10-02 DIAGNOSIS — M66329 Spontaneous rupture of flexor tendons, unspecified upper arm: Secondary | ICD-10-CM | POA: Diagnosis not present

## 2012-10-02 DIAGNOSIS — J90 Pleural effusion, not elsewhere classified: Secondary | ICD-10-CM | POA: Diagnosis not present

## 2012-10-02 DIAGNOSIS — Z23 Encounter for immunization: Secondary | ICD-10-CM | POA: Diagnosis not present

## 2012-10-05 DIAGNOSIS — M66329 Spontaneous rupture of flexor tendons, unspecified upper arm: Secondary | ICD-10-CM | POA: Diagnosis not present

## 2012-10-05 DIAGNOSIS — S43439A Superior glenoid labrum lesion of unspecified shoulder, initial encounter: Secondary | ICD-10-CM | POA: Diagnosis not present

## 2012-10-07 DIAGNOSIS — H2510 Age-related nuclear cataract, unspecified eye: Secondary | ICD-10-CM | POA: Diagnosis not present

## 2012-10-09 DIAGNOSIS — M66329 Spontaneous rupture of flexor tendons, unspecified upper arm: Secondary | ICD-10-CM | POA: Diagnosis not present

## 2012-10-12 DIAGNOSIS — M66329 Spontaneous rupture of flexor tendons, unspecified upper arm: Secondary | ICD-10-CM | POA: Diagnosis not present

## 2012-10-16 DIAGNOSIS — J90 Pleural effusion, not elsewhere classified: Secondary | ICD-10-CM | POA: Diagnosis not present

## 2012-10-16 DIAGNOSIS — S43439A Superior glenoid labrum lesion of unspecified shoulder, initial encounter: Secondary | ICD-10-CM | POA: Diagnosis not present

## 2012-10-16 DIAGNOSIS — M66329 Spontaneous rupture of flexor tendons, unspecified upper arm: Secondary | ICD-10-CM | POA: Diagnosis not present

## 2012-10-19 DIAGNOSIS — M66329 Spontaneous rupture of flexor tendons, unspecified upper arm: Secondary | ICD-10-CM | POA: Diagnosis not present

## 2012-10-21 DIAGNOSIS — M66329 Spontaneous rupture of flexor tendons, unspecified upper arm: Secondary | ICD-10-CM | POA: Diagnosis not present

## 2012-10-22 ENCOUNTER — Other Ambulatory Visit: Payer: Self-pay | Admitting: Orthopedic Surgery

## 2012-10-22 DIAGNOSIS — M19019 Primary osteoarthritis, unspecified shoulder: Secondary | ICD-10-CM | POA: Diagnosis not present

## 2012-11-02 ENCOUNTER — Encounter (HOSPITAL_COMMUNITY): Payer: Self-pay | Admitting: Respiratory Therapy

## 2012-11-03 DIAGNOSIS — M19019 Primary osteoarthritis, unspecified shoulder: Secondary | ICD-10-CM | POA: Diagnosis not present

## 2012-11-10 ENCOUNTER — Encounter (HOSPITAL_COMMUNITY)
Admission: RE | Admit: 2012-11-10 | Discharge: 2012-11-10 | Disposition: A | Discharge status: Home / Self Care | Payer: Medicare Other | Source: Ambulatory Visit | Attending: Orthopedic Surgery | Admitting: Orthopedic Surgery

## 2012-11-10 ENCOUNTER — Encounter (HOSPITAL_COMMUNITY): Payer: Self-pay

## 2012-11-10 HISTORY — DX: Personal history of other medical treatment: Z92.89

## 2012-11-10 HISTORY — DX: Frequency of micturition: R35.0

## 2012-11-10 HISTORY — DX: Personal history of colonic polyps: Z86.010

## 2012-11-10 HISTORY — DX: Personal history of colon polyps, unspecified: Z86.0100

## 2012-11-10 HISTORY — DX: Pleural effusion, not elsewhere classified: J90

## 2012-11-10 HISTORY — DX: Unspecified cataract: H26.9

## 2012-11-10 HISTORY — DX: Other specified disorders of bone density and structure, unspecified site: M85.80

## 2012-11-10 HISTORY — DX: Unspecified hemorrhoids: K64.9

## 2012-11-10 HISTORY — DX: Pain in unspecified joint: M25.50

## 2012-11-10 HISTORY — DX: Personal history of other diseases of the digestive system: Z87.19

## 2012-11-10 LAB — COMPREHENSIVE METABOLIC PANEL
ALT: 11 U/L (ref 0–35)
AST: 20 U/L (ref 0–37)
Albumin: 3.7 g/dL (ref 3.5–5.2)
Alkaline Phosphatase: 58 U/L (ref 39–117)
CO2: 33 mEq/L — ABNORMAL HIGH (ref 19–32)
Chloride: 97 mEq/L (ref 96–112)
Creatinine, Ser: 0.75 mg/dL (ref 0.50–1.10)
GFR calc non Af Amer: 80 mL/min — ABNORMAL LOW (ref >90)
Potassium: 3.9 mEq/L (ref 3.5–5.1)
Total Bilirubin: 0.5 mg/dL (ref 0.3–1.2)

## 2012-11-10 LAB — URINALYSIS, ROUTINE W REFLEX MICROSCOPIC
Glucose, UA: NEGATIVE mg/dL
Nitrite: NEGATIVE
Specific Gravity, Urine: 1.017 (ref 1.005–1.030)
pH: 8 (ref 5.0–8.0)

## 2012-11-10 LAB — URINE MICROSCOPIC-ADD ON

## 2012-11-10 LAB — TYPE AND SCREEN

## 2012-11-10 LAB — SURGICAL PCR SCREEN: Staphylococcus aureus: NEGATIVE

## 2012-11-10 LAB — CBC WITH DIFFERENTIAL/PLATELET
Basophils Absolute: 0 10*3/uL (ref 0.0–0.1)
HCT: 45.8 % (ref 36.0–46.0)
Hemoglobin: 15.1 g/dL — ABNORMAL HIGH (ref 12.0–15.0)
Lymphocytes Relative: 19 % (ref 12–46)
Monocytes Absolute: 0.8 10*3/uL (ref 0.1–1.0)
Neutro Abs: 7.2 10*3/uL (ref 1.7–7.7)
Neutrophils Relative %: 72 % (ref 43–77)
RDW: 12.5 % (ref 11.5–15.5)
WBC: 10 10*3/uL (ref 4.0–10.5)

## 2012-11-10 LAB — PROTIME-INR: INR: 0.98 (ref 0.00–1.49)

## 2012-11-10 LAB — APTT: aPTT: 31 seconds (ref 24–37)

## 2012-11-10 LAB — ABO/RH: ABO/RH(D): A NEG

## 2012-11-10 NOTE — Pre-Procedure Instructions (Signed)
20 Christina Levy  11/10/2012   Your procedure is scheduled on:  Mon, Dec 2 @ 12:30 PM  Report to [facility] at 10:30 AM.  Call this number if you have problems the morning of surgery: [phone]   Remember:   Do not eat food:After Midnight.    Take these medicines the morning of surgery with A SIP OF WATER: Pain Pill(if needed)   Do not wear jewelry, make-up or nail polish.  Do not wear lotions, powders, or perfumes. You may wear deodorant.  Do not shave 48 hours prior to surgery.  Contacts, dentures or bridgework may not be worn into surgery.  Leave suitcase in the car. After surgery it may be brought to your room.  For patients admitted to the hospital, checkout time is 11:00 AM the day of discharge.   Patients discharged the day of surgery will not be allowed to drive home.    Special Instructions: Shower using CHG 2 nights before surgery and the night before surgery.  If you shower the day of surgery use CHG.  Use special wash - you have one bottle of CHG for all showers.  You should use approximately 1/3 of the bottle for each shower.   Please read over the following fact sheets that you were given: Pain Booklet, Coughing and Deep Breathing, Blood Transfusion Information, MRSA Information and Surgical Site Infection Prevention

## 2012-11-10 NOTE — Progress Notes (Signed)
Pt doesn't have a cardiologist  Denies ever having an echo/heart cath/stress test  Medical Md is Dr.John Griffin  EKG in epic from 09/2012 CXR to be requested from Dr.Griffin

## 2012-11-11 LAB — URINE CULTURE

## 2012-11-15 MED ORDER — CLINDAMYCIN PHOSPHATE 900 MG/50ML IV SOLN
900.0000 mg | INTRAVENOUS | Status: AC
  Administered 2012-11-16: 900 mg via INTRAVENOUS
  Filled 2012-11-15: qty 50

## 2012-11-16 ENCOUNTER — Encounter (HOSPITAL_COMMUNITY)
Admission: RE | Disposition: A | Discharge status: Home / Self Care | Payer: Self-pay | Source: Ambulatory Visit | Attending: Orthopedic Surgery

## 2012-11-16 ENCOUNTER — Inpatient Hospital Stay (HOSPITAL_COMMUNITY): Payer: Medicare Other

## 2012-11-16 ENCOUNTER — Encounter (HOSPITAL_COMMUNITY): Payer: Self-pay | Admitting: Anesthesiology

## 2012-11-16 ENCOUNTER — Encounter (HOSPITAL_COMMUNITY): Payer: Self-pay | Admitting: *Deleted

## 2012-11-16 ENCOUNTER — Encounter (HOSPITAL_COMMUNITY): Payer: Self-pay | Admitting: General Practice

## 2012-11-16 ENCOUNTER — Inpatient Hospital Stay (HOSPITAL_COMMUNITY): Payer: Medicare Other | Admitting: Anesthesiology

## 2012-11-16 ENCOUNTER — Inpatient Hospital Stay (HOSPITAL_COMMUNITY)
Admission: RE | Admit: 2012-11-16 | Discharge: 2012-11-17 | DRG: 484 | Disposition: A | Discharge status: Home / Self Care | Payer: Medicare Other | Source: Ambulatory Visit | Attending: Orthopedic Surgery | Admitting: Orthopedic Surgery

## 2012-11-16 DIAGNOSIS — Y92009 Unspecified place in unspecified non-institutional (private) residence as the place of occurrence of the external cause: Secondary | ICD-10-CM

## 2012-11-16 DIAGNOSIS — Y998 Other external cause status: Secondary | ICD-10-CM

## 2012-11-16 DIAGNOSIS — M19019 Primary osteoarthritis, unspecified shoulder: Secondary | ICD-10-CM | POA: Diagnosis not present

## 2012-11-16 DIAGNOSIS — Z882 Allergy status to sulfonamides status: Secondary | ICD-10-CM | POA: Diagnosis not present

## 2012-11-16 DIAGNOSIS — E785 Hyperlipidemia, unspecified: Secondary | ICD-10-CM | POA: Diagnosis not present

## 2012-11-16 DIAGNOSIS — I10 Essential (primary) hypertension: Secondary | ICD-10-CM | POA: Diagnosis not present

## 2012-11-16 DIAGNOSIS — Z471 Aftercare following joint replacement surgery: Secondary | ICD-10-CM | POA: Diagnosis not present

## 2012-11-16 DIAGNOSIS — G8918 Other acute postprocedural pain: Secondary | ICD-10-CM | POA: Diagnosis not present

## 2012-11-16 DIAGNOSIS — M66329 Spontaneous rupture of flexor tendons, unspecified upper arm: Secondary | ICD-10-CM | POA: Diagnosis not present

## 2012-11-16 DIAGNOSIS — Z880 Allergy status to penicillin: Secondary | ICD-10-CM

## 2012-11-16 DIAGNOSIS — M949 Disorder of cartilage, unspecified: Secondary | ICD-10-CM | POA: Diagnosis not present

## 2012-11-16 DIAGNOSIS — M25519 Pain in unspecified shoulder: Secondary | ICD-10-CM | POA: Diagnosis not present

## 2012-11-16 DIAGNOSIS — M899 Disorder of bone, unspecified: Secondary | ICD-10-CM | POA: Diagnosis not present

## 2012-11-16 DIAGNOSIS — Z96619 Presence of unspecified artificial shoulder joint: Secondary | ICD-10-CM | POA: Diagnosis not present

## 2012-11-16 DIAGNOSIS — S43429A Sprain of unspecified rotator cuff capsule, initial encounter: Secondary | ICD-10-CM | POA: Diagnosis present

## 2012-11-16 DIAGNOSIS — X58XXXA Exposure to other specified factors, initial encounter: Secondary | ICD-10-CM | POA: Diagnosis present

## 2012-11-16 DIAGNOSIS — M19011 Primary osteoarthritis, right shoulder: Secondary | ICD-10-CM | POA: Diagnosis present

## 2012-11-16 HISTORY — PX: TOTAL SHOULDER ARTHROPLASTY: SHX126

## 2012-11-16 HISTORY — DX: Unspecified malignant neoplasm of skin of unspecified part of face: C44.300

## 2012-11-16 HISTORY — PX: TOTAL SHOULDER REPLACEMENT: SUR1217

## 2012-11-16 HISTORY — DX: Iron deficiency anemia, unspecified: D50.9

## 2012-11-16 SURGERY — ARTHROPLASTY, SHOULDER, TOTAL
Anesthesia: General | Site: Shoulder | Laterality: Right | Wound class: Clean

## 2012-11-16 MED ORDER — CLINDAMYCIN PHOSPHATE 600 MG/50ML IV SOLN
600.0000 mg | Freq: Four times a day (QID) | INTRAVENOUS | Status: AC
  Administered 2012-11-16 – 2012-11-17 (×3): 600 mg via INTRAVENOUS
  Filled 2012-11-16 (×3): qty 50

## 2012-11-16 MED ORDER — ROCURONIUM BROMIDE 100 MG/10ML IV SOLN
INTRAVENOUS | Status: DC | PRN
  Administered 2012-11-16: 50 mg via INTRAVENOUS

## 2012-11-16 MED ORDER — METOCLOPRAMIDE HCL 5 MG/ML IJ SOLN: 5.0000 mg | Freq: Three times a day (TID) | INTRAMUSCULAR | Status: DC | PRN

## 2012-11-16 MED ORDER — NEOSTIGMINE METHYLSULFATE 1 MG/ML IJ SOLN
INTRAMUSCULAR | Status: DC | PRN
  Administered 2012-11-16: 2 mg via INTRAVENOUS

## 2012-11-16 MED ORDER — OXYCODONE-ACETAMINOPHEN 5-325 MG PO TABS
1.0000 | ORAL_TABLET | ORAL | Status: DC | PRN
  Administered 2012-11-17 (×2): 2 via ORAL
  Filled 2012-11-16 (×2): qty 2

## 2012-11-16 MED ORDER — FENTANYL CITRATE 0.05 MG/ML IJ SOLN
50.0000 ug | INTRAMUSCULAR | Status: DC | PRN
  Administered 2012-11-16: 100 ug via INTRAVENOUS

## 2012-11-16 MED ORDER — TRIAMTERENE-HCTZ 37.5-25 MG PO CAPS
1.0000 | ORAL_CAPSULE | Freq: Every day | ORAL | Status: DC
  Administered 2012-11-17: 1 via ORAL
  Filled 2012-11-16: qty 1

## 2012-11-16 MED ORDER — DEXTROSE-NACL 5-0.45 % IV SOLN
INTRAVENOUS | Status: DC
  Administered 2012-11-17: via INTRAVENOUS

## 2012-11-16 MED ORDER — SODIUM CHLORIDE 0.9 % IR SOLN
Status: DC | PRN
  Administered 2012-11-16: 1000 mL

## 2012-11-16 MED ORDER — ATORVASTATIN CALCIUM 10 MG PO TABS
10.0000 mg | ORAL_TABLET | Freq: Every day | ORAL | Status: DC
  Administered 2012-11-16 – 2012-11-17 (×2): 10 mg via ORAL
  Filled 2012-11-16 (×2): qty 1

## 2012-11-16 MED ORDER — ACETAMINOPHEN 10 MG/ML IV SOLN
1000.0000 mg | Freq: Four times a day (QID) | INTRAVENOUS | Status: AC
  Administered 2012-11-16 – 2012-11-17 (×2): 1000 mg via INTRAVENOUS
  Filled 2012-11-16 (×3): qty 100

## 2012-11-16 MED ORDER — MIDAZOLAM HCL 2 MG/2ML IJ SOLN
1.0000 mg | INTRAMUSCULAR | Status: DC | PRN
  Administered 2012-11-16: 2 mg via INTRAVENOUS

## 2012-11-16 MED ORDER — FENTANYL CITRATE 0.05 MG/ML IJ SOLN
INTRAMUSCULAR | Status: AC
  Filled 2012-11-16: qty 2

## 2012-11-16 MED ORDER — LACTATED RINGERS IV SOLN
INTRAVENOUS | Status: DC
  Administered 2012-11-16 (×2): via INTRAVENOUS

## 2012-11-16 MED ORDER — ONDANSETRON HCL 4 MG/2ML IJ SOLN: 4.0000 mg | Freq: Four times a day (QID) | INTRAMUSCULAR | Status: DC | PRN

## 2012-11-16 MED ORDER — PROMETHAZINE HCL 25 MG/ML IJ SOLN: 6.2500 mg | INTRAMUSCULAR | Status: DC | PRN

## 2012-11-16 MED ORDER — ARTIFICIAL TEARS OP OINT
TOPICAL_OINTMENT | OPHTHALMIC | Status: DC | PRN
  Administered 2012-11-16: 1 via OPHTHALMIC

## 2012-11-16 MED ORDER — HYDROMORPHONE HCL PF 1 MG/ML IJ SOLN: 0.2500 mg | INTRAMUSCULAR | Status: DC | PRN

## 2012-11-16 MED ORDER — ONDANSETRON HCL 4 MG PO TABS: 4.0000 mg | ORAL_TABLET | Freq: Four times a day (QID) | ORAL | Status: DC | PRN

## 2012-11-16 MED ORDER — POVIDONE-IODINE 7.5 % EX SOLN
Freq: Once | CUTANEOUS | Status: DC
  Filled 2012-11-16: qty 118

## 2012-11-16 MED ORDER — POLYETHYLENE GLYCOL 3350 17 G PO PACK: 17.0000 g | PACK | Freq: Every day | ORAL | Status: DC | PRN

## 2012-11-16 MED ORDER — ALUM & MAG HYDROXIDE-SIMETH 200-200-20 MG/5ML PO SUSP: 30.0000 mL | ORAL | Status: DC | PRN

## 2012-11-16 MED ORDER — FENTANYL CITRATE 0.05 MG/ML IJ SOLN
INTRAMUSCULAR | Status: DC | PRN
  Administered 2012-11-16: 250 ug via INTRAVENOUS

## 2012-11-16 MED ORDER — GLYCOPYRROLATE 0.2 MG/ML IJ SOLN
INTRAMUSCULAR | Status: DC | PRN
  Administered 2012-11-16: 0.2 mg via INTRAVENOUS

## 2012-11-16 MED ORDER — OXYCODONE-ACETAMINOPHEN 5-325 MG PO TABS: 1.0000 | ORAL_TABLET | Freq: Four times a day (QID) | ORAL | Status: DC | PRN

## 2012-11-16 MED ORDER — ONDANSETRON HCL 4 MG/2ML IJ SOLN
INTRAMUSCULAR | Status: DC | PRN
  Administered 2012-11-16: 4 mg via INTRAVENOUS

## 2012-11-16 MED ORDER — METHOCARBAMOL 500 MG PO TABS
500.0000 mg | ORAL_TABLET | Freq: Four times a day (QID) | ORAL | Status: DC | PRN
  Administered 2012-11-17: 500 mg via ORAL
  Filled 2012-11-16: qty 1

## 2012-11-16 MED ORDER — METHOCARBAMOL 100 MG/ML IJ SOLN
500.0000 mg | Freq: Four times a day (QID) | INTRAVENOUS | Status: DC | PRN
  Filled 2012-11-16: qty 5

## 2012-11-16 MED ORDER — BUPIVACAINE-EPINEPHRINE PF 0.5-1:200000 % IJ SOLN
INTRAMUSCULAR | Status: DC | PRN
  Administered 2012-11-16: 150 mg

## 2012-11-16 MED ORDER — OXYCODONE HCL 5 MG/5ML PO SOLN: 5.0000 mg | Freq: Once | ORAL | Status: DC | PRN

## 2012-11-16 MED ORDER — MIDAZOLAM HCL 2 MG/2ML IJ SOLN
INTRAMUSCULAR | Status: AC
  Filled 2012-11-16: qty 2

## 2012-11-16 MED ORDER — HYDROMORPHONE HCL PF 1 MG/ML IJ SOLN
0.5000 mg | INTRAMUSCULAR | Status: DC | PRN
  Administered 2012-11-17 (×2): 1 mg via INTRAVENOUS
  Filled 2012-11-16 (×2): qty 1

## 2012-11-16 MED ORDER — DEXAMETHASONE SODIUM PHOSPHATE 4 MG/ML IJ SOLN
INTRAMUSCULAR | Status: DC | PRN
  Administered 2012-11-16: 10 mg

## 2012-11-16 MED ORDER — LIDOCAINE HCL (CARDIAC) 20 MG/ML IV SOLN
INTRAVENOUS | Status: DC | PRN
  Administered 2012-11-16: 20 mg via INTRAVENOUS

## 2012-11-16 MED ORDER — OXYCODONE HCL 5 MG PO TABS: 5.0000 mg | ORAL_TABLET | Freq: Once | ORAL | Status: DC | PRN

## 2012-11-16 MED ORDER — CHLORHEXIDINE GLUCONATE 4 % EX LIQD: 60.0000 mL | Freq: Once | CUTANEOUS | Status: DC

## 2012-11-16 MED ORDER — METOCLOPRAMIDE HCL 10 MG PO TABS: 5.0000 mg | ORAL_TABLET | Freq: Three times a day (TID) | ORAL | Status: DC | PRN

## 2012-11-16 MED ORDER — FENTANYL 25 MCG/HR TD PT72
25.0000 ug | MEDICATED_PATCH | TRANSDERMAL | Status: DC
  Administered 2012-11-16: 25 ug via TRANSDERMAL
  Filled 2012-11-16: qty 1

## 2012-11-16 MED ORDER — PROPOFOL 10 MG/ML IV BOLUS
INTRAVENOUS | Status: DC | PRN
  Administered 2012-11-16: 150 mg via INTRAVENOUS

## 2012-11-16 MED ORDER — ZOLPIDEM TARTRATE 5 MG PO TABS: 5.0000 mg | ORAL_TABLET | Freq: Every evening | ORAL | Status: DC | PRN

## 2012-11-16 MED ORDER — EPHEDRINE SULFATE 50 MG/ML IJ SOLN
INTRAMUSCULAR | Status: DC | PRN
  Administered 2012-11-16: 15 mg via INTRAVENOUS
  Administered 2012-11-16: 5 mg via INTRAVENOUS

## 2012-11-16 SURGICAL SUPPLY — 77 items
BENZOIN TINCTURE PRP APPL 2/3 (GAUZE/BANDAGES/DRESSINGS) ×2 IMPLANT
BLADE SAW SAG 29X58X.64 (BLADE) ×2 IMPLANT
BLADE SURG ROTATE 9660 (MISCELLANEOUS) IMPLANT
BOWL SMART MIX CTS (DISPOSABLE) IMPLANT
CEMENT BONE DEPUY (Cement) ×2 IMPLANT
CLOTH BEACON ORANGE TIMEOUT ST (SAFETY) ×2 IMPLANT
COVER SURGICAL LIGHT HANDLE (MISCELLANEOUS) ×2 IMPLANT
COVER TABLE BACK 60X90 (DRAPES) IMPLANT
DRAPE C-ARM 42X72 X-RAY (DRAPES) IMPLANT
DRAPE INCISE IOBAN 66X45 STRL (DRAPES) ×4 IMPLANT
DRAPE PROXIMA HALF (DRAPES) ×2 IMPLANT
DRAPE U-SHAPE 47X51 STRL (DRAPES) ×4 IMPLANT
DRILL BIT 7/64X5 (BIT) ×2 IMPLANT
DRSG MEPILEX BORDER 4X8 (GAUZE/BANDAGES/DRESSINGS) ×2 IMPLANT
DRSG PAD ABDOMINAL 8X10 ST (GAUZE/BANDAGES/DRESSINGS) IMPLANT
DURAPREP 26ML APPLICATOR (WOUND CARE) ×2 IMPLANT
ELECT CAUTERY BLADE 6.4 (BLADE) ×2 IMPLANT
ELECT NEEDLE TIP 2.8 STRL (NEEDLE) IMPLANT
ELECT REM PT RETURN 9FT ADLT (ELECTROSURGICAL) ×2
ELECTRODE REM PT RTRN 9FT ADLT (ELECTROSURGICAL) ×1 IMPLANT
EVACUATOR 1/8 PVC DRAIN (DRAIN) IMPLANT
GAUZE SPONGE 4X4 16PLY XRAY LF (GAUZE/BANDAGES/DRESSINGS) ×2 IMPLANT
GAUZE XEROFORM 5X9 LF (GAUZE/BANDAGES/DRESSINGS) IMPLANT
GLENOID ANCHOR PEG CROSSLK 44 (Orthopedic Implant) ×2 IMPLANT
GLOVE BIOGEL PI IND STRL 8 (GLOVE) ×3 IMPLANT
GLOVE BIOGEL PI INDICATOR 8 (GLOVE) ×3
GLOVE ECLIPSE 7.5 STRL STRAW (GLOVE) ×4 IMPLANT
GLOVE SURG SS PI 8.0 STRL IVOR (GLOVE) ×2 IMPLANT
GOWN SRG XL XLNG 56XLVL 4 (GOWN DISPOSABLE) ×3 IMPLANT
GOWN STRL NON-REIN LRG LVL3 (GOWN DISPOSABLE) ×2 IMPLANT
GOWN STRL NON-REIN XL XLG LVL4 (GOWN DISPOSABLE) ×3
HANDPIECE INTERPULSE COAX TIP (DISPOSABLE)
HEAD HUM ECCENTRIC 44X18 STRL (Trauma) ×2 IMPLANT
HOOD PEEL AWAY FACE SHEILD DIS (HOOD) ×2 IMPLANT
HUMERAL STEM 10MM (Trauma) ×2 IMPLANT
KIT BASIN OR (CUSTOM PROCEDURE TRAY) ×2 IMPLANT
KIT ROOM TURNOVER OR (KITS) ×2 IMPLANT
MANIFOLD NEPTUNE II (INSTRUMENTS) ×2 IMPLANT
NDL SUT 2 .5 CRC MAYO 1.732X (NEEDLE) ×1 IMPLANT
NEEDLE 22X1 1/2 (OR ONLY) (NEEDLE) IMPLANT
NEEDLE MAYO TAPER (NEEDLE) ×1
NOZZLE PRISM 8.5MM (MISCELLANEOUS) IMPLANT
NS IRRIG 1000ML POUR BTL (IV SOLUTION) ×2 IMPLANT
PACK SHOULDER (CUSTOM PROCEDURE TRAY) ×2 IMPLANT
PAD ARMBOARD 7.5X6 YLW CONV (MISCELLANEOUS) ×4 IMPLANT
PASSER SUT SWANSON 36MM LOOP (INSTRUMENTS) ×2 IMPLANT
PIN METAGLENE 2.5 (PIN) ×2 IMPLANT
PRESSURIZER FEMORAL UNIV (MISCELLANEOUS) IMPLANT
SET HNDPC FAN SPRY TIP SCT (DISPOSABLE) IMPLANT
SLING ARM IMMOBILIZER LRG (SOFTGOODS) ×2 IMPLANT
SLING ARM IMMOBILIZER MED (SOFTGOODS) IMPLANT
SMARTMIX MINI TOWER (MISCELLANEOUS) ×2
SPONGE LAP 18X18 X RAY DECT (DISPOSABLE) ×2 IMPLANT
SPONGE LAP 4X18 X RAY DECT (DISPOSABLE) IMPLANT
STAPLER VISISTAT 35W (STAPLE) IMPLANT
STEM HUMERAL 10MM (Trauma) ×1 IMPLANT
STRIP CLOSURE SKIN 1/2X4 (GAUZE/BANDAGES/DRESSINGS) ×2 IMPLANT
SUCTION FRAZIER TIP 10 FR DISP (SUCTIONS) ×2 IMPLANT
SUT ETHIBOND 2 V 37 (SUTURE) IMPLANT
SUT FIBERWIRE #2 38 T-5 BLUE (SUTURE) ×10
SUT MNCRL AB 3-0 PS2 18 (SUTURE) ×2 IMPLANT
SUT SILK 2 0 (SUTURE)
SUT SILK 2-0 18XBRD TIE 12 (SUTURE) IMPLANT
SUT VIC AB 0 CT1 27 (SUTURE) ×1
SUT VIC AB 0 CT1 27XBRD ANBCTR (SUTURE) ×1 IMPLANT
SUT VIC AB 2-0 CT1 27 (SUTURE) ×1
SUT VIC AB 2-0 CT1 TAPERPNT 27 (SUTURE) ×1 IMPLANT
SUT VICRYL 0 TIES 12 18 (SUTURE) IMPLANT
SUTURE FIBERWR #2 38 T-5 BLUE (SUTURE) ×5 IMPLANT
SYR CONTROL 10ML LL (SYRINGE) IMPLANT
TOWEL OR 17X24 6PK STRL BLUE (TOWEL DISPOSABLE) ×2 IMPLANT
TOWEL OR 17X26 10 PK STRL BLUE (TOWEL DISPOSABLE) ×2 IMPLANT
TOWER CARTRIDGE SMART MIX (DISPOSABLE) IMPLANT
TOWER SMARTMIX MINI (MISCELLANEOUS) ×1 IMPLANT
TRAY FOLEY CATH 14FR (SET/KITS/TRAYS/PACK) ×2 IMPLANT
WATER STERILE IRR 1000ML POUR (IV SOLUTION) IMPLANT
YANKAUER SUCT BULB TIP NO VENT (SUCTIONS) IMPLANT

## 2012-11-16 NOTE — Anesthesia Preprocedure Evaluation (Addendum)
Anesthesia Evaluation  Patient identified by MRN, date of birth, ID band Patient awake    Reviewed: Allergy & Precautions, H&P , NPO status , Patient's Chart, lab work & pertinent test results  History of Anesthesia Complications Negative for: history of anesthetic complications  Airway Mallampati: II TM Distance: >3 FB Neck ROM: Full    Dental  (+) Teeth Intact   Pulmonary neg pulmonary ROS,    Pulmonary exam normal       Cardiovascular hypertension, Pt. on medications     Neuro/Psych negative neurological ROS     GI/Hepatic negative GI ROS, Neg liver ROS, hiatal hernia,   Endo/Other  negative endocrine ROS  Renal/GU negative Renal ROS     Musculoskeletal   Abdominal   Peds  Hematology negative hematology ROS (+)   Anesthesia Other Findings   Reproductive/Obstetrics                          Anesthesia Physical Anesthesia Plan  ASA: III  Anesthesia Plan: General   Post-op Pain Management:    Induction: Intravenous  Airway Management Planned: Oral ETT  Additional Equipment:   Intra-op Plan:   Post-operative Plan: Extubation in OR  Informed Consent: I have reviewed the patients History and Physical, chart, labs and discussed the procedure including the risks, benefits and alternatives for the proposed anesthesia with the patient or authorized representative who has indicated his/her understanding and acceptance.   Dental advisory given  Plan Discussed with: CRNA, Anesthesiologist and Surgeon  Anesthesia Plan Comments:        Anesthesia Quick Evaluation

## 2012-11-16 NOTE — Anesthesia Procedure Notes (Addendum)
Anesthesia Regional Block:  Interscalene brachial plexus block  Pre-Anesthetic Checklist: ,, timeout performed, Correct Patient, Correct Site, Correct Laterality, Correct Procedure, Correct Position, site marked, Risks and benefits discussed,  Surgical consent,  Pre-op evaluation,  At surgeon's request and post-op pain management  Laterality: Right  Prep: chloraprep       Needles:  Injection technique: Single-shot  Needle Type: Echogenic Stimulator Needle     Needle Length: 5cm 5 cm Needle Gauge: 22 and 22 G    Additional Needles:  Procedures: ultrasound guided (picture in chart) and nerve stimulator Interscalene brachial plexus block  Nerve Stimulator or Paresthesia:  Response: bicep contraction, 0.45 mA,   Additional Responses:   Narrative:  Start time: 11/16/2012 12:39 PM End time: 11/16/2012 12:49 PM Injection made incrementally with aspirations every 5 mL.  Performed by: Personally  Anesthesiologist: J. Adonis Huguenin, MD  Additional Notes: Functioning IV was confirmed and monitors applied.  A 50mm 22ga echogenic arrow stimulator was used. Sterile prep and drape,hand hygiene and sterile gloves were used.Ultrasound guidance: relevant anatomy identified, needle position confirmed, local anesthetic spread visualized around nerve(s)., vascular puncture avoided.  Image printed for medical record.  Negative aspiration and negative test dose prior to incremental administration of local anesthetic. The patient tolerated the procedure well.  Interscalene brachial plexus block Procedure Name: Intubation Date/Time: 11/16/2012 1:50 PM Performed by: Jefm Miles E Pre-anesthesia Checklist: Patient identified, Timeout performed, Emergency Drugs available, Suction available and Patient being monitored Patient Re-evaluated:Patient Re-evaluated prior to inductionOxygen Delivery Method: Circle system utilized Preoxygenation: Pre-oxygenation with 100% oxygen Intubation Type: IV  induction Ventilation: Mask ventilation without difficulty Laryngoscope Size: Mac and 3 Grade View: Grade I Tube type: Oral Tube size: 7.0 mm Number of attempts: 1 Airway Equipment and Method: Stylet Placement Confirmation: ETT inserted through vocal cords under direct vision,  breath sounds checked- equal and bilateral and positive ETCO2 Secured at: 23 cm Tube secured with: Tape Dental Injury: Teeth and Oropharynx as per pre-operative assessment

## 2012-11-16 NOTE — H&P (Signed)
PREOPERATIVE H&P  Chief Complaint: r shoulder pain  HPI: Christina Levy is a 78 y.o. female who presents for evaluation of r shoulder pain. It has been present for 6 months and has been worsening. She has failed conservative measures. She has failed scope and conservative care and has bone on bone changes on x-ray.  Pain is rated as moderate.  Past Medical History  Diagnosis Date  . Arthritis   . Hyperlipemia     takes lIpitor nightly  . Psoriasis   . Hypertension     takes Dyaziide daily  . Pleural effusion     in oct 2013  . Joint pain   . Osteopenia   . H/O hiatal hernia   . History of colon polyps   . Hemorrhoids   . Urinary frequency   . Anemia   . History of blood transfusion     last time about 25+yrs ago  . Cataract     left but immature   Past Surgical History  Procedure Date  . Tonsillectomy   . Hernia repair 2005    rt lower abd  . Colonoscopy   . Veins stripped 45-68yrs ago  . Right shoulder arthroscopy   . Right cataract removed   . Tubal ligation 93yrs ago   History   Social History  . Marital Status: Married    Spouse Name: N/A    Number of Children: N/A  . Years of Education: N/A   Social History Main Topics  . Smoking status: Never Smoker   . Smokeless tobacco: None  . Alcohol Use: No  . Drug Use: No  . Sexually Active: Yes    Birth Control/ Protection: Post-menopausal   Other Topics Concern  . None   Social History Narrative  . None   History reviewed. No pertinent family history. Allergies  Allergen Reactions  . Penicillins Hives  . Sulfa Antibiotics Rash   Prior to Admission medications   Medication Sig Start Date End Date Taking? Authorizing Provider  acitretin (SORIATANE) 25 MG capsule Take 25 mg by mouth every Monday, Wednesday, and Friday.    Yes Historical Provider, MD  atorvastatin (LIPITOR) 10 MG tablet Take 10 mg by mouth daily.   Yes Historical Provider, MD  Biotin (APPEAREX) 2.5 MG TABS Take 2.5 mg by mouth  daily.   Yes Historical Provider, MD  calcium carbonate (OS-CAL) 600 MG TABS Take 600 mg by mouth 2 (two) times daily with a meal.   Yes Historical Provider, MD  co-enzyme Q-10 30 MG capsule Take 30 mg by mouth daily.    Yes Historical Provider, MD  glucosamine-chondroitin 500-400 MG tablet Take 1 tablet by mouth 2 (two) times daily.    Yes Historical Provider, MD  magnesium oxide (MAG-OX) 400 MG tablet Take 400 mg by mouth 2 (two) times daily.    Yes Historical Provider, MD  oxyCODONE-acetaminophen (PERCOCET/ROXICET) 5-325 MG per tablet Take 1 tablet by mouth every 4 (four) hours as needed. For pain   Yes Historical Provider, MD  raloxifene (EVISTA) 60 MG tablet Take 60 mg by mouth daily.   Yes Historical Provider, MD  triamterene-hydrochlorothiazide (DYAZIDE) 37.5-25 MG per capsule Take 1 capsule by mouth every morning.   Yes Historical Provider, MD  fentaNYL (DURAGESIC - DOSED MCG/HR) 25 MCG/HR Place 1 patch onto the skin every 3 (three) days.    Historical Provider, MD     Positive ROS: none  All other systems have been reviewed and were otherwise negative with  the exception of those mentioned in the HPI and as above.  Physical Exam: Filed Vitals:   11/16/12 1248  BP:   Pulse: 78  Temp:   Resp: 14    General: Alert, no acute distress Cardiovascular: No pedal edema Respiratory: No cyanosis, no use of accessory musculature GI: No organomegaly, abdomen is soft and non-tender Skin: No lesions in the area of chief complaint Neurologic: Sensation intact distally Psychiatric: Patient is competent for consent with normal mood and affect Lymphatic: No axillary or cervical lymphadenopathy  MUSCULOSKELETAL: painful rom r shoulder + grind   Assessment/Plan: DJD RIGHT SHOULDER  Plan for Procedure(s): TOTAL SHOULDER ARTHROPLASTY  The risks benefits and alternatives were discussed with the patient including but not limited to the risks of nonoperative treatment, versus surgical  intervention including infection, bleeding, nerve injury, malunion, nonunion, hardware prominence, hardware failure, need for hardware removal, blood clots, cardiopulmonary complications, morbidity, mortality, among others, and they were willing to proceed.  Predicted outcome is good, although there will be at least a six to nine month expected recovery.  Reisha Wos L, MD 11/16/2012 1:30 PM

## 2012-11-16 NOTE — Preoperative (Signed)
Beta Blockers   Reason not to administer Beta Blockers:Not Applicable 

## 2012-11-16 NOTE — Transfer of Care (Signed)
Immediate Anesthesia Transfer of Care Note  Patient: Christina Levy  Procedure(s) Performed: Procedure(s) (LRB) with comments: TOTAL SHOULDER ARTHROPLASTY (Right)  Patient Location: PACU  Anesthesia Type:General  Level of Consciousness: awake, oriented, patient cooperative and responds to stimulation  Airway & Oxygen Therapy: Patient Spontanous Breathing and Patient connected to nasal cannula oxygen  Post-op Assessment: Report given to PACU RN and Patient moving all extremities  Post vital signs: Reviewed and stable  Complications: No apparent anesthesia complications

## 2012-11-16 NOTE — Brief Op Note (Signed)
11/16/2012  4:21 PM  PATIENT:  Christina Levy  78 y.o. female  PRE-OPERATIVE DIAGNOSIS:  Degenerative joint disease RIGHT SHOULDER   POST-OPERATIVE DIAGNOSIS:  Degenerative joint disease RIGHT SHOULDER  Rotator cuff tear.  Bicepts tendon rupturer  PROCEDURE:  Procedure(s) (LRB) with comments: TOTAL SHOULDER ARTHROPLASTY (Right) #2 rotator cuff tear  #3 bicepts tenodesis  SURGEON:  Surgeon(s) and Role:    * Harvie Junior, MD - Primary  PHYSICIAN ASSISTANT:   ASSISTANTS: bethune   ANESTHESIA:   general  EBL:  Total I/O In: 1000 [I.V.:1000] Out: 300 [Urine:300]  BLOOD ADMINISTERED:none  DRAINS: none   LOCAL MEDICATIONS USED:  NONE  SPECIMEN:  No Specimen  DISPOSITION OF SPECIMEN:  N/A  COUNTS:  YES  TOURNIQUET:  * No tourniquets in log *  DICTATION: .Other Dictation: Dictation Number [id number]  PLAN OF CARE: Admit to inpatient   PATIENT DISPOSITION:  PACU - hemodynamically stable.   Delay start of Pharmacological VTE agent (>24hrs) due to surgical blood loss or risk of bleeding: no

## 2012-11-17 ENCOUNTER — Encounter (HOSPITAL_COMMUNITY): Payer: Self-pay | Admitting: Orthopedic Surgery

## 2012-11-17 LAB — HEMOGLOBIN AND HEMATOCRIT, BLOOD: Hemoglobin: 10.5 g/dL — ABNORMAL LOW (ref 12.0–15.0)

## 2012-11-17 MED ORDER — METHOCARBAMOL 500 MG PO TABS: 500.0000 mg | ORAL_TABLET | Freq: Four times a day (QID) | ORAL | Status: DC | PRN

## 2012-11-17 MED ORDER — KETOROLAC TROMETHAMINE 15 MG/ML IJ SOLN
15.0000 mg | Freq: Four times a day (QID) | INTRAMUSCULAR | Status: DC
  Filled 2012-11-17 (×4): qty 1

## 2012-11-17 MED ORDER — KETOROLAC TROMETHAMINE 15 MG/ML IJ SOLN
15.0000 mg | Freq: Four times a day (QID) | INTRAMUSCULAR | Status: DC | PRN
Start: 2012-11-17 — End: 2012-11-17
  Administered 2012-11-17: 15 mg via INTRAVENOUS
  Filled 2012-11-17: qty 1

## 2012-11-17 NOTE — Progress Notes (Signed)
Subjective: 1 Day Post-Op Procedure(s) (LRB): TOTAL SHOULDER ARTHROPLASTY (Right) Patient reports pain as mild.    Objective: Vital signs in last 24 hours: Temp:  [97.9 F (36.6 C)-98.2 F (36.8 C)] 98.1 F (36.7 C) (12/03 0600) Pulse Rate:  [66-84] 74  (12/03 0600) Resp:  [10-19] 16  (12/03 0600) BP: (108-173)/(49-105) 108/49 mmHg (12/03 0600) SpO2:  [92 %-100 %] 99 % (12/03 0600)  Intake/Output from previous day: 12/02 0701 - 12/03 0700 In: 2102.5 [I.V.:1902.5; IV Piggyback:200] Out: 475 [Urine:400; Blood:75] Intake/Output this shift:     Basename 11/17/12 0645  HGB 10.5*    Basename 11/17/12 0645  WBC --  RBC --  HCT 31.6*  PLT --   No results found for this basename: NA:2,K:2,CL:2,CO2:2,BUN:2,CREATININE:2,GLUCOSE:2,CALCIUM:2 in the last 72 hours No results found for this basename: LABPT:2,INR:2 in the last 72 hours  Neurologically intact ABD soft Neurovascular intact Sensation intact distally Intact pulses distally Compartment soft  Assessment/Plan: 1 Day Post-Op Procedure(s) (LRB): TOTAL SHOULDER ARTHROPLASTY (Right) Advance diet Up with therapy D/C IV fluids Discharge home with home health  Kathya Wilz L 11/17/2012, 7:51 AM

## 2012-11-17 NOTE — Discharge Summary (Signed)
Patient ID: Christina Levy MRN: [id number] DOB/AGE: 76-Nov-1935 76 y.o.  Admit date: 11/16/2012 Discharge date: 11/17/2012  Admission Diagnoses:  Principal Problem:  *Primary osteoarthritis of right shoulder   Discharge Diagnoses:  Same  Past Medical History  Diagnosis Date  . Hyperlipemia     takes lIpitor nightly  . Psoriasis   . Hypertension     takes Dyaziide daily  . Pleural effusion 09/2012  . Joint pain   . Osteopenia   . H/O hiatal hernia   . History of colon polyps   . Hemorrhoids   . Urinary frequency   . History of blood transfusion 1990's  . Cataract     left but immature  . Iron deficiency anemia 1990's  . Arthritis     "right shoulder; lower back" (11/16/2012)  . Skin cancer of face     Surgeries: Procedure(s):Right TOTAL SHOULDER ARTHROPLASTY on 11/16/2012  Discharged Condition: Improved  Hospital Course: Christina Levy is an 78 y.o. female who was admitted 11/16/2012 for operative treatment ofPrimary osteoarthritis of right shoulder. Patient has severe unremitting pain that affects sleep, daily activities, and work/hobbies. After pre-op clearance the patient was taken to the operating room on 11/16/2012 and underwent  Procedure(s):Right TOTAL SHOULDER ARTHROPLASTY.    Patient was given perioperative antibiotics: Anti-infectives     Start     Dose/Rate Route Frequency Ordered Stop   11/16/12 2000   clindamycin (CLEOCIN) IVPB 600 mg        600 mg 100 mL/hr over 30 Minutes Intravenous Every 6 hours 11/16/12 1821 11/17/12 0842   11/15/12 1154   clindamycin (CLEOCIN) IVPB 900 mg        900 mg 100 mL/hr over 30 Minutes Intravenous 60 min pre-op 11/15/12 1154 11/16/12 1353           Patient was given sequential compression devices and early ambulation to prevent DVT.she was evaluated and treated by occupational therapy.  Patient benefited maximally from hospital stay and there were no complications.    Recent vital signs: Patient Vitals for  the past 24 hrs:  BP Temp Temp src Pulse Resp SpO2  11/17/12 0956 121/61 mmHg 98.5 F (36.9 C) Oral 72  18  94 %  11/17/12 0600 108/49 mmHg 98.1 F (36.7 C) - 74  16  99 %  11/16/12 2144 153/70 mmHg 98.2 F (36.8 C) - 78  18  100 %  11/16/12 1815 134/70 mmHg 98.1 F (36.7 C) - 82  18  92 %  11/16/12 1745 153/62 mmHg 97.9 F (36.6 C) - 80  19  94 %  11/16/12 1730 156/75 mmHg - - 74  19  94 %  11/16/12 1715 140/62 mmHg - - 66  17  96 %  11/16/12 1700 141/61 mmHg 97.9 F (36.6 C) - 67  15  95 %  11/16/12 1248 - - - 78  14  99 %  11/16/12 1239 - - - 79  10  100 %  11/16/12 1222 - - - 70  18  100 %  11/16/12 1221 - - - 68  18  100 %  11/16/12 1220 - - - 71  18  100 %  11/16/12 1041 130/74 mmHg - - - - -  11/16/12 1006 173/105 mmHg 98.1 F (36.7 C) Oral 84  18  95 %     Recent laboratory studies:  Basename 11/17/12 0645  WBC --  HGB 10.5*  HCT 31.6*  PLT --  NA --  K --  CL --  CO2 --  BUN --  CREATININE --  GLUCOSE --  INR --  CALCIUM --     Discharge Medications:     Medication List     As of 11/17/2012 10:03 AM    STOP taking these medications         fentaNYL 25 MCG/HR   Commonly known as: DURAGESIC - dosed mcg/hr      TAKE these medications         acitretin 25 MG capsule   Commonly known as: SORIATANE   Take 25 mg by mouth every Monday, Wednesday, and Friday.      APPEAREX 2.5 MG Tabs   Generic drug: Biotin   Take 2.5 mg by mouth daily.      atorvastatin 10 MG tablet   Commonly known as: LIPITOR   Take 10 mg by mouth daily.      calcium carbonate 600 MG Tabs   Commonly known as: OS-CAL   Take 600 mg by mouth 2 (two) times daily with a meal.      co-enzyme Q-10 30 MG capsule   Take 30 mg by mouth daily.      glucosamine-chondroitin 500-400 MG tablet   Take 1 tablet by mouth 2 (two) times daily.      magnesium oxide 400 MG tablet   Commonly known as: MAG-OX   Take 400 mg by mouth 2 (two) times daily.      methocarbamol 500 MG tablet    Commonly known as: ROBAXIN   Take 1 tablet (500 mg total) by mouth every 6 (six) hours as needed.      oxyCODONE-acetaminophen 5-325 MG per tablet   Commonly known as: PERCOCET/ROXICET   Take 1-2 tablets by mouth every 6 (six) hours as needed for pain.      oxyCODONE-acetaminophen 5-325 MG per tablet   Commonly known as: PERCOCET/ROXICET   Take 1 tablet by mouth every 4 (four) hours as needed. For pain      raloxifene 60 MG tablet   Commonly known as: EVISTA   Take 60 mg by mouth daily.      triamterene-hydrochlorothiazide 37.5-25 MG per capsule   Commonly known as: DYAZIDE   Take 1 capsule by mouth every morning.        Diagnostic Studies: Dg Shoulder Right Port  2012-11-17  *RADIOLOGY REPORT*  Clinical Data: Postop total right shoulder replacement.  PORTABLE RIGHT SHOULDER - 2+ VIEW  Comparison: 10/16/2012 chest x-ray.  Findings: Post total right shoulder replacement.  On this single projection, this appears in relatively satisfactory position without complication noted.  This can be assessed on follow-up.  Resection of the distal right clavicle.  Pulmonary vascular prominence.  IMPRESSION: Post total right shoulder replacement.  On this single projection, this appears in relatively satisfactory position without complication noted.   Original Report Authenticated By: Lacy Duverney, M.D.     Disposition: 01-Home or Self Care      Discharge Orders    Future Orders Please Complete By Expires   Diet - low sodium heart healthy      Call MD / Call 911      Comments:   If you experience chest pain or shortness of breath, CALL 911 and be transported to the hospital emergency room.  If you develope a fever above 101 F, pus (white drainage) or increased drainage or redness at the wound, or calf pain, call your surgeon's office.   Constipation Prevention  Comments:   Drink plenty of fluids.  Prune juice may be helpful.  You may use a stool softener, such as Colace (over the counter)  100 mg twice a day.  Use MiraLax (over the counter) for constipation as needed.   Increase activity slowly as tolerated         Follow-up Information    Follow up with GRAVES,JOHN L, MD. Schedule an appointment as soon as possible for a visit in 7 days.   Contact information:   [address] [place] [address] [phone]           Signed: Matthew Folks 11/17/2012, 10:03 AM

## 2012-11-17 NOTE — Evaluation (Signed)
Occupational Therapy Evaluation Patient Details Name: Christina Levy MRN: [id number] DOB: Apr 12, 1934 Today's Date: 11/17/2012 Time: 6295-2841 OT Time Calculation (min): 48 min  OT Assessment / Plan / Recommendation Clinical Impression  78 yo female s/p Rt TSA that could benefit from skilled OT at d/c home. OT will sign off acutely. Pt is at adequate level for d/c    OT Assessment  Patient does not need any further OT services    Follow Up Recommendations  Home health OT    Barriers to Discharge      Equipment Recommendations  None recommended by OT    Recommendations for Other Services    Frequency       Precautions / Restrictions Precautions Precautions: Shoulder Type of Shoulder Precautions: NWB, Passive FF 90 NO external rotation Precaution Booklet Issued: Yes (comment) Precaution Comments: Handout provided for shoulder Required Braces or Orthoses: Other Brace/Splint Other Brace/Splint: blue sling Restrictions Weight Bearing Restrictions: Yes RUE Weight Bearing: Non weight bearing   Pertinent Vitals/Pain Minimal Tolerating session well and reports receiving pain medication prior to OT arrival    ADL  Eating/Feeding: Set up Where Assessed - Eating/Feeding: Chair Grooming: Teeth care;Wash/dry hands;Set up Where Assessed - Grooming: Unsupported standing Upper Body Bathing: Chest;Right arm;Left arm;Abdomen;Moderate assistance Where Assessed - Upper Body Bathing: Unsupported sit to stand Lower Body Bathing: Minimal assistance Where Assessed - Lower Body Bathing: Supported sit to stand Upper Body Dressing: Minimal assistance Where Assessed - Upper Body Dressing: Unsupported standing Toilet Transfer: Minimal assistance Toilet Transfer Method: Sit to stand Toilet Transfer Equipment: Comfort height toilet Toileting - Clothing Manipulation and Hygiene: Minimal assistance Where Assessed - Toileting Clothing Manipulation and Hygiene: Sit to stand from 3-in-1 or  toilet Transfers/Ambulation Related to ADLs: Pt supervision for ambulation ADL Comments: Pt and daughter educated on dressing, bathing and Rt UE positioning. Pt and daughter don / doff sling independently. Pt and daughter completed shoulder FF exercise 70 degrees independently. Pt is at adequate level for d/c. Educated that Rt Ue is okay to dangle and NO EXTERNAL rotation    OT Diagnosis:    OT Problem List:   OT Treatment Interventions:     OT Goals    Visit Information  Last OT Received On: 11/16/12 Assistance Needed: +1    Subjective Data  Subjective: "This dressing isn't nearly as big as last time" Patient Stated Goal: to go [place] in [place]   Prior Functioning     Home Living Lives With: Spouse Available Help at Discharge: Family;Friend(s) Type of Home: House Additional Comments: Pt has bed with HOB and foot move. Pt plans to sleep in recliner. Pt with previous shoulder management from previous surgery. Prior Function Level of Independence: Independent Able to Take Stairs?: Yes Driving: No Vocation: Retired Musician: No difficulties Dominant Hand: Right         Vision/Perception     Cognition  Overall Cognitive Status: Appears within functional limits for tasks assessed/performed Arousal/Alertness: Awake/alert Orientation Level: Appears intact for tasks assessed Behavior During Session: [facility] for tasks performed    Extremity/Trunk Assessment Right Upper Extremity Assessment RUE ROM/Strength/Tone: Unable to fully assess;Deficits RUE Sensation: Deficits RUE Sensation Deficits: numbess at fingers Left Upper Extremity Assessment LUE ROM/Strength/Tone: Within functional levels Trunk Assessment Trunk Assessment: Kyphotic     Mobility Bed Mobility Bed Mobility: Supine to Sit;Sitting - Scoot to Edge of Bed Supine to Sit: 5: Supervision;With rails Sitting - Scoot to Edge of Bed: 5: Supervision Details  for Bed  Mobility Assistance: [facility] for d/c home Transfers Transfers: Sit to Stand;Stand to Sit Sit to Stand: 4: Min guard;With upper extremity assist;From bed Stand to Sit: 4: Min guard;With upper extremity assist;To chair/3-in-1 Details for Transfer Assistance: Pt verbalized safety and exiting bed on Left side not right side     Shoulder Instructions     Exercise     Balance     End of Session OT - End of Session Activity Tolerance: Patient tolerated treatment well Patient left: in chair;with call bell/phone within reach Nurse Communication: Mobility status;Precautions  GO Functional Assessment Tool Used: clinical judgement Functional Limitation: Self care Self Care Current Status (N5621): At least 1 percent but less than 20 percent impaired, limited or restricted Self Care Goal Status (H0865): At least 1 percent but less than 20 percent impaired, limited or restricted Self Care Discharge Status [phone]): At least 1 percent but less than 20 percent impaired, limited or restricted   Lucile Shutters 11/17/2012, 9:57 AM Pager: [phone]

## 2012-11-17 NOTE — Anesthesia Postprocedure Evaluation (Addendum)
  Anesthesia Post-op Note  Patient: Christina Levy  Procedure(s) Performed: Procedure(s) (LRB) with comments: TOTAL SHOULDER ARTHROPLASTY (Right)  Patient Location: PACU and Nursing Unit  Anesthesia Type:GA combined with regional for post-op pain  Level of Consciousness: awake, alert  and oriented  Airway and Oxygen Therapy: Patient Spontanous Breathing  Post-op Pain: none  Post-op Assessment: Post-op Vital signs reviewed, Patient's Cardiovascular Status Stable, Respiratory Function Stable, Patent Airway and Pain level controlled  Post-op Vital Signs: Reviewed and stable  Complications: No apparent anesthesia complications

## 2012-11-17 NOTE — Op Note (Signed)
NAME:  LUANNE, KRZYZANOWSKI NO.:  [id number]  MEDICAL RECORD NO.:  [id number]  LOCATION:  [id number]                        FACILITY:  [facility]  PHYSICIAN:  Harvie Junior, M.D.   DATE OF BIRTH:  07-09-1934  DATE OF PROCEDURE:  11/16/2012 DATE OF DISCHARGE:                              OPERATIVE REPORT   PREOPERATIVE DIAGNOSIS:  Severe degenerative joint disease, right shoulder with failure of all conservative care.  POSTOPERATIVE DIAGNOSES: 1. Severe degenerative joint disease, right shoulder with failure of     all conservative care. 2. Rotator cuff tear. 3. Biceps tendon rupture.  PRINCIPAL PROCEDURES: 1. Total shoulder replacement, right, with a [employer] system,     size 10 humerus with a 44 eccentric head +18 in a direct posterior     position. 2. Rotator cuff repair with FiberWire interrupted sutures. 3. Biceps tenodesis of the biceps tendon to the upper border of the     pectorals major tendon.  SURGEON:  Harvie Junior, M.D.  ASSISTANT:  Marshia Ly, P.A.  ANESTHESIA:  General.  BRIEF HISTORY:  Ms. Haviland is a 78 year old female with history of having significant complaints of right shoulder pain.  She ultimately underwent arthroscopy several years ago, which showed she had significant degenerative change of the humeral head and glenoid.  There was no rotator cuff tear.  She was treated conservatively for period of time, got better with injection therapy.  After failure of all of these, she came in, talking about total shoulder replacement.  X-rays did not show bone-on-bone change.  We knew from her arthroscopy, she did have significant areas of exposed bone on both the glenoid side and on the humeral head side, but at this point, we felt that the arthroscopy may be beneficial for.  We did an arthroscopic debridement, she did well initially, but then pain recurred and at that point because of failure of conservative care, failure of arthroscopic  intervention, arthroscopic imaging showing bone-on-bone changes, she was ultimately taken to the operating room for right total shoulder replacement.  PROCEDURE:  The patient was taken to the operating room and after adequate level of anesthesia was obtained with general anesthetic, the patient was placed supine on the operating table, was moved in the beach- chair position, and all bony prominences were well padded.  Attention was turned to the right shoulder.  After routine prep and drape, an incision was made for an anterior approach to the shoulder, subcutaneous tissues down the level of the deltopectoral interval and the cephalic vein was identified just medial to the cephalic vein.  We developed the deltopectoral interval and put a retractor in place.  The upper border of the pectoralis was then released.  The adhesions on the undersurface of the deltoid were released and then at this point, a subscapularis osteotomy was performed with a piece of bone coming out of the subscap. Once this was performed, the subscap was tagged, then attention was turned towards the humeral head where provisional neck cut was made and then it was sequentially reamed to a level of 10.  When we got good distal interlock at 10, we broached up to 10, 10 had a very nice fit  by broach and we then turned to the socket side, the socket was sized to a 44.  A central wire was placed and we reamed over this to excoriate the bone.  Once this was done, the center peg was drilled.  Once that was drilled, the glenoid drills were made top and then two in the bottom. Once this was done, this vault was copiously and thoroughly irrigated, suctioned dry, and once this was done, three twist of cement were placed to each of the peg holes, and after bone had been placed on the central peg of the glenoid, the final glenoid was hammered into place and held in place while the cement hardened.  At this time, attention was  turned back to the stem where three sutures were passed through the bicipital groove, through the implant and then out around, and these were held out of position while attention was being turned towards the subscap and the subscap was put in place with a Mason-Allen type stitch and then the subscap was reduced and held in place in the osteotomy position and the sutures were tied one of the time getting an excellent repair of the subscap osteotomy back to the bicipital groove.  Once this was completed, interrupted stitches were passed underneath the bicipital groove allowing additional repair of the subscapularis tendon.  Once this was done, the arm could easily be brought up overhead, easy full range of motion was achievable and this had all been trialed prior to the final implants being placed.  Once the final implant was placed with the finally eccentric head, we did the repair as outlined.  At this point we repaired the rotator cuff with 2 figure of 8 sutures giving great coverage of the humeral head.  At this point, the wound was copiously and thoroughly lavaged and suctioned dry. The deltopectoral interval was allowed to fall together.  The deltopectoral interval was closed with 0 Vicryl, 2-0 Vicryl and then 3-0 Monocryl subcuticular stitches were applied in the skin.  A sterile compressive dressing was applied, and the patient was taken to the recovery room, she was noted to be in satisfactory condition, and an arm sling.  The estimated blood loss for the procedure was less than 250 mL.     Harvie Junior, M.D.     Ranae Plumber  D:  11/16/2012  T:  11/17/2012  Job:  [id number]

## 2012-11-17 NOTE — Progress Notes (Signed)
UR COMPLETED  

## 2012-11-19 NOTE — Anesthesia Postprocedure Evaluation (Signed)
  Anesthesia Post-op Note  Patient: Christina Levy  Procedure(s) Performed: Procedure(s) (LRB) with comments: TOTAL SHOULDER ARTHROPLASTY (Right)  Patient Location: PACU and PICU  Anesthesia Type:General and GA combined with regional for post-op pain  Level of Consciousness: awake  Airway and Oxygen Therapy: Patient Spontanous Breathing  Post-op Pain: none  Post-op Assessment: Post-op Vital signs reviewed  Post-op Vital Signs: stable  Complications: No apparent anesthesia complications

## 2012-11-24 DIAGNOSIS — M19019 Primary osteoarthritis, unspecified shoulder: Secondary | ICD-10-CM | POA: Diagnosis not present

## 2012-11-27 DIAGNOSIS — M19019 Primary osteoarthritis, unspecified shoulder: Secondary | ICD-10-CM | POA: Diagnosis not present

## 2012-11-27 DIAGNOSIS — Z96619 Presence of unspecified artificial shoulder joint: Secondary | ICD-10-CM | POA: Diagnosis not present

## 2012-12-02 DIAGNOSIS — Z96619 Presence of unspecified artificial shoulder joint: Secondary | ICD-10-CM | POA: Diagnosis not present

## 2012-12-02 DIAGNOSIS — M19019 Primary osteoarthritis, unspecified shoulder: Secondary | ICD-10-CM | POA: Diagnosis not present

## 2012-12-10 DIAGNOSIS — M7020 Olecranon bursitis, unspecified elbow: Secondary | ICD-10-CM | POA: Diagnosis not present

## 2012-12-10 DIAGNOSIS — M19019 Primary osteoarthritis, unspecified shoulder: Secondary | ICD-10-CM | POA: Diagnosis not present

## 2012-12-10 DIAGNOSIS — M25519 Pain in unspecified shoulder: Secondary | ICD-10-CM | POA: Diagnosis not present

## 2012-12-10 DIAGNOSIS — Z96619 Presence of unspecified artificial shoulder joint: Secondary | ICD-10-CM | POA: Diagnosis not present

## 2013-03-05 DIAGNOSIS — K440 Diaphragmatic hernia with obstruction, without gangrene: Secondary | ICD-10-CM | POA: Diagnosis not present

## 2013-03-10 DIAGNOSIS — K440 Diaphragmatic hernia with obstruction, without gangrene: Secondary | ICD-10-CM | POA: Diagnosis not present

## 2013-05-13 ENCOUNTER — Other Ambulatory Visit: Payer: Self-pay

## 2013-05-13 DIAGNOSIS — Z1231 Encounter for screening mammogram for malignant neoplasm of breast: Secondary | ICD-10-CM

## 2013-07-05 ENCOUNTER — Ambulatory Visit
Admission: RE | Admit: 2013-07-05 | Discharge: 2013-07-05 | Disposition: A | Discharge status: Home / Self Care | Payer: Medicare Other | Source: Ambulatory Visit

## 2013-07-05 DIAGNOSIS — Z1231 Encounter for screening mammogram for malignant neoplasm of breast: Secondary | ICD-10-CM | POA: Diagnosis not present

## 2013-09-06 IMAGING — CR DG SHOULDER 2+V PORT*R*
1 series · 1 of 1 positions shown · non-contrast
Comparison: 10/16/2012 chest x-ray.

CLINICAL DATA: Postop total right shoulder replacement.

PORTABLE RIGHT SHOULDER - 2+ VIEW

[AP]
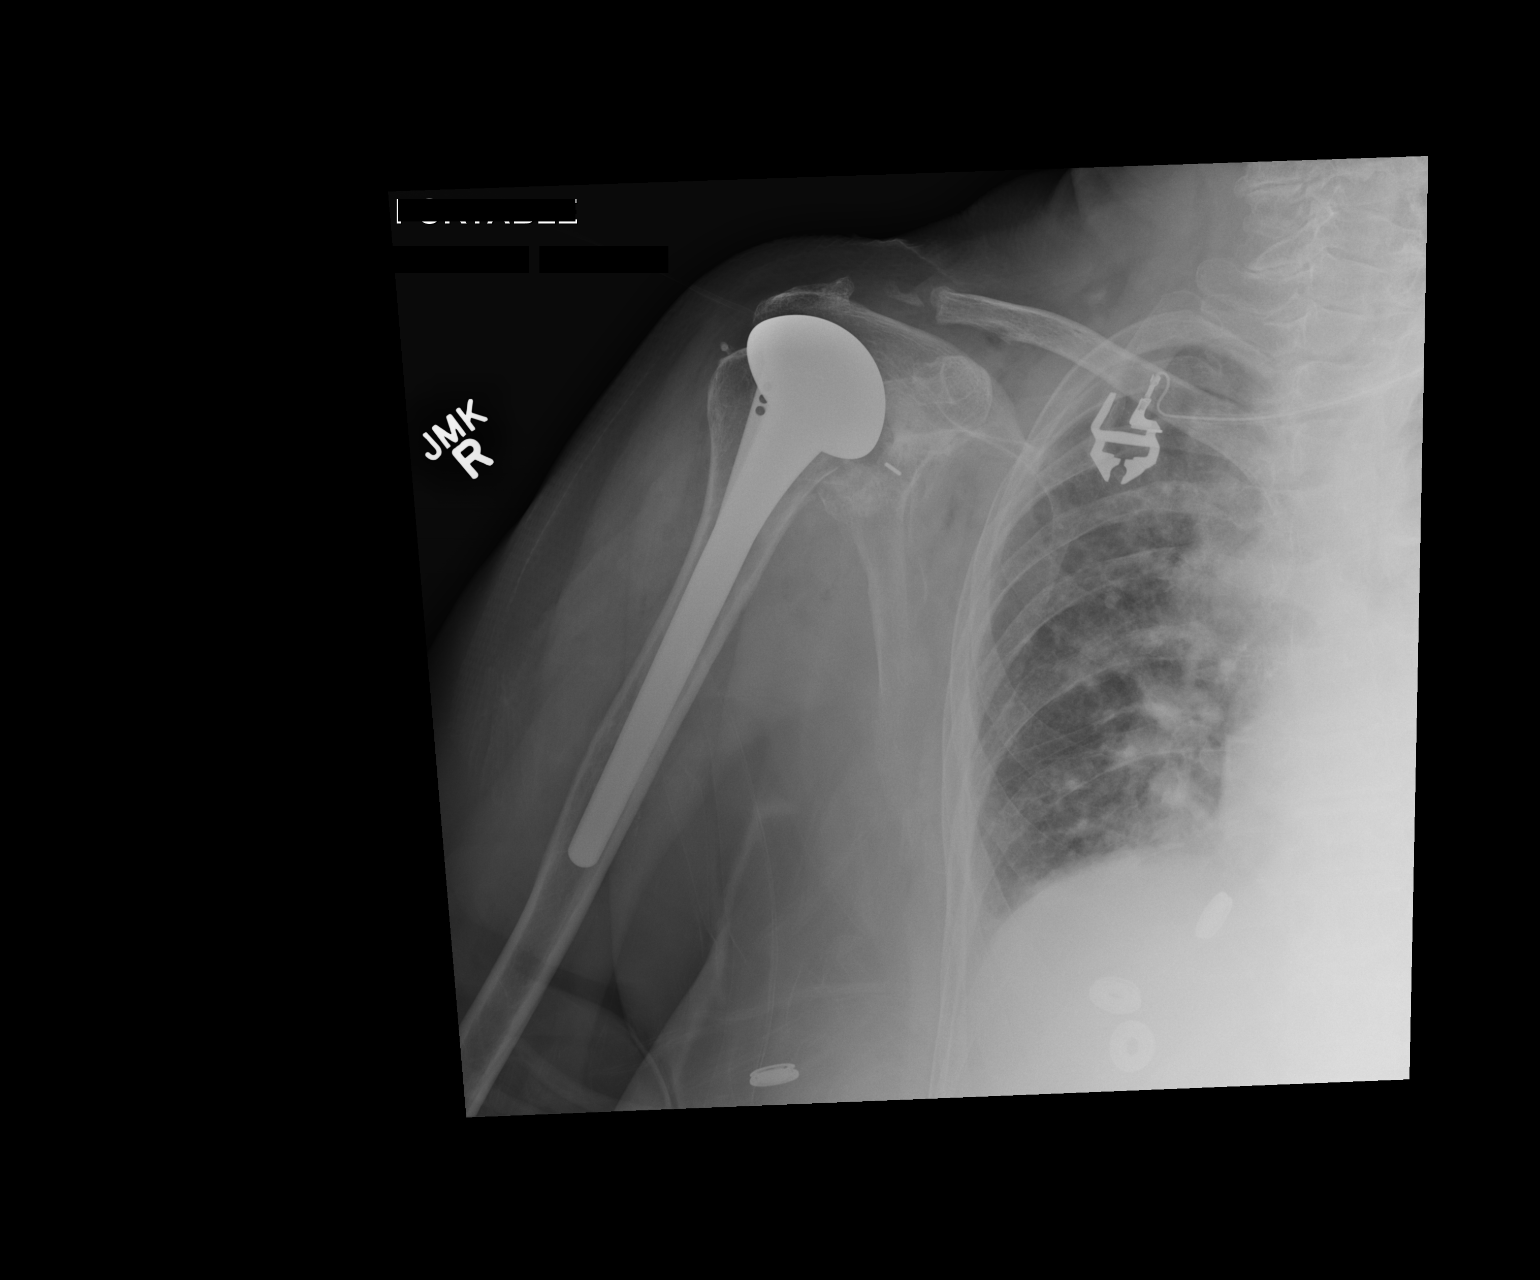

[1 of 1 positions shown; findings below may reference images not displayed]

FINDINGS: Post total right shoulder replacement.  On this single
projection, this appears in relatively satisfactory position
without complication noted.  This can be assessed on follow-up.

Resection of the distal right clavicle.

Pulmonary vascular prominence.
IMPRESSION: Post total right shoulder replacement.  On this single projection,
this appears in relatively satisfactory position without
complication noted.

## 2013-09-23 ENCOUNTER — Other Ambulatory Visit (HOSPITAL_COMMUNITY): Payer: Self-pay | Admitting: Internal Medicine

## 2013-09-23 ENCOUNTER — Ambulatory Visit (HOSPITAL_COMMUNITY)
Admission: RE | Admit: 2013-09-23 | Discharge: 2013-09-23 | Disposition: A | Discharge status: Home / Self Care | Payer: Medicare Other | Source: Ambulatory Visit | Attending: Internal Medicine | Admitting: Internal Medicine

## 2013-09-23 DIAGNOSIS — M25569 Pain in unspecified knee: Secondary | ICD-10-CM | POA: Insufficient documentation

## 2013-09-23 DIAGNOSIS — R599 Enlarged lymph nodes, unspecified: Secondary | ICD-10-CM | POA: Insufficient documentation

## 2013-09-23 DIAGNOSIS — M79609 Pain in unspecified limb: Secondary | ICD-10-CM

## 2013-09-23 DIAGNOSIS — M25561 Pain in right knee: Secondary | ICD-10-CM

## 2013-09-23 NOTE — Progress Notes (Signed)
Right lower extremity venous duplex completed.  Right:  No evidence of DVT, superficial thrombosis, or Baker's cyst.  Left:  Negative for DVT in the common femoral vein.  

## 2013-09-29 ENCOUNTER — Ambulatory Visit (HOSPITAL_COMMUNITY)
Admission: RE | Admit: 2013-09-29 | Discharge: 2013-09-29 | Disposition: A | Discharge status: Home / Self Care | Payer: Medicare Other | Source: Ambulatory Visit | Attending: Nurse Practitioner | Admitting: Nurse Practitioner

## 2013-09-29 ENCOUNTER — Other Ambulatory Visit (HOSPITAL_COMMUNITY): Payer: Self-pay | Admitting: Nurse Practitioner

## 2013-09-29 DIAGNOSIS — M79609 Pain in unspecified limb: Secondary | ICD-10-CM | POA: Insufficient documentation

## 2013-09-29 NOTE — Progress Notes (Signed)
*  Preliminary Results* Right lower extremity venous duplex completed. Right lower extremity is negative for deep vein thrombosis. There is no evidence of right Baker's cyst.  Preliminary results discussed with Levonne Lapping, NP.  09/29/2013 2:53 PM  Gertie Fey, RVT, RDCS, RDMS

## 2013-10-07 ENCOUNTER — Other Ambulatory Visit: Payer: Self-pay | Admitting: Dermatology

## 2013-11-15 ENCOUNTER — Ambulatory Visit
Admission: RE | Admit: 2013-11-15 | Discharge: 2013-11-15 | Disposition: A | Discharge status: Home / Self Care | Payer: Medicare Other | Source: Ambulatory Visit | Attending: Internal Medicine | Admitting: Internal Medicine

## 2013-11-15 ENCOUNTER — Other Ambulatory Visit: Payer: Self-pay | Admitting: Internal Medicine

## 2013-11-15 DIAGNOSIS — M79609 Pain in unspecified limb: Secondary | ICD-10-CM

## 2014-01-06 DIAGNOSIS — M25519 Pain in unspecified shoulder: Secondary | ICD-10-CM | POA: Diagnosis not present

## 2014-01-06 DIAGNOSIS — M79609 Pain in unspecified limb: Secondary | ICD-10-CM | POA: Diagnosis not present

## 2014-01-19 DIAGNOSIS — M810 Age-related osteoporosis without current pathological fracture: Secondary | ICD-10-CM | POA: Diagnosis not present

## 2014-01-19 DIAGNOSIS — Z1331 Encounter for screening for depression: Secondary | ICD-10-CM | POA: Diagnosis not present

## 2014-01-19 DIAGNOSIS — E785 Hyperlipidemia, unspecified: Secondary | ICD-10-CM | POA: Diagnosis not present

## 2014-01-19 DIAGNOSIS — R7301 Impaired fasting glucose: Secondary | ICD-10-CM | POA: Diagnosis not present

## 2014-01-19 DIAGNOSIS — Z0000 Encounter for general adult medical examination without abnormal findings: Secondary | ICD-10-CM | POA: Diagnosis not present

## 2014-02-16 ENCOUNTER — Other Ambulatory Visit: Payer: Self-pay

## 2014-02-16 DIAGNOSIS — C44611 Basal cell carcinoma of skin of unspecified upper limb, including shoulder: Secondary | ICD-10-CM | POA: Diagnosis not present

## 2014-02-16 DIAGNOSIS — L408 Other psoriasis: Secondary | ICD-10-CM | POA: Diagnosis not present

## 2014-02-16 DIAGNOSIS — L538 Other specified erythematous conditions: Secondary | ICD-10-CM | POA: Diagnosis not present

## 2014-02-16 DIAGNOSIS — Z85828 Personal history of other malignant neoplasm of skin: Secondary | ICD-10-CM | POA: Diagnosis not present

## 2014-02-16 DIAGNOSIS — L820 Inflamed seborrheic keratosis: Secondary | ICD-10-CM | POA: Diagnosis not present

## 2014-02-16 DIAGNOSIS — L570 Actinic keratosis: Secondary | ICD-10-CM | POA: Diagnosis not present

## 2014-02-16 DIAGNOSIS — L821 Other seborrheic keratosis: Secondary | ICD-10-CM | POA: Diagnosis not present

## 2014-02-16 DIAGNOSIS — L819 Disorder of pigmentation, unspecified: Secondary | ICD-10-CM | POA: Diagnosis not present

## 2014-02-17 DIAGNOSIS — H2510 Age-related nuclear cataract, unspecified eye: Secondary | ICD-10-CM | POA: Diagnosis not present

## 2014-02-21 DIAGNOSIS — M19019 Primary osteoarthritis, unspecified shoulder: Secondary | ICD-10-CM | POA: Diagnosis not present

## 2014-02-21 DIAGNOSIS — M79609 Pain in unspecified limb: Secondary | ICD-10-CM | POA: Diagnosis not present

## 2014-03-17 ENCOUNTER — Other Ambulatory Visit: Payer: Self-pay

## 2014-03-17 DIAGNOSIS — Z85828 Personal history of other malignant neoplasm of skin: Secondary | ICD-10-CM | POA: Diagnosis not present

## 2014-03-17 DIAGNOSIS — C44611 Basal cell carcinoma of skin of unspecified upper limb, including shoulder: Secondary | ICD-10-CM | POA: Diagnosis not present

## 2014-04-05 DIAGNOSIS — M19019 Primary osteoarthritis, unspecified shoulder: Secondary | ICD-10-CM | POA: Diagnosis not present

## 2014-04-18 DIAGNOSIS — M1610 Unilateral primary osteoarthritis, unspecified hip: Secondary | ICD-10-CM | POA: Diagnosis not present

## 2014-04-18 DIAGNOSIS — M169 Osteoarthritis of hip, unspecified: Secondary | ICD-10-CM | POA: Diagnosis not present

## 2014-04-18 DIAGNOSIS — M25519 Pain in unspecified shoulder: Secondary | ICD-10-CM | POA: Diagnosis not present

## 2014-04-18 DIAGNOSIS — M12359 Palindromic rheumatism, unspecified hip: Secondary | ICD-10-CM | POA: Diagnosis not present

## 2014-04-28 DIAGNOSIS — M19019 Primary osteoarthritis, unspecified shoulder: Secondary | ICD-10-CM | POA: Diagnosis not present

## 2014-05-03 DIAGNOSIS — M25819 Other specified joint disorders, unspecified shoulder: Secondary | ICD-10-CM | POA: Diagnosis not present

## 2014-05-03 DIAGNOSIS — M19019 Primary osteoarthritis, unspecified shoulder: Secondary | ICD-10-CM | POA: Diagnosis not present

## 2014-05-18 DIAGNOSIS — L538 Other specified erythematous conditions: Secondary | ICD-10-CM | POA: Diagnosis not present

## 2014-05-18 DIAGNOSIS — Z85828 Personal history of other malignant neoplasm of skin: Secondary | ICD-10-CM | POA: Diagnosis not present

## 2014-05-18 DIAGNOSIS — L259 Unspecified contact dermatitis, unspecified cause: Secondary | ICD-10-CM | POA: Diagnosis not present

## 2014-05-18 DIAGNOSIS — Z79899 Other long term (current) drug therapy: Secondary | ICD-10-CM | POA: Diagnosis not present

## 2014-05-18 DIAGNOSIS — L408 Other psoriasis: Secondary | ICD-10-CM | POA: Diagnosis not present

## 2014-06-06 DIAGNOSIS — M25819 Other specified joint disorders, unspecified shoulder: Secondary | ICD-10-CM | POA: Diagnosis not present

## 2014-06-06 DIAGNOSIS — M25519 Pain in unspecified shoulder: Secondary | ICD-10-CM | POA: Diagnosis not present

## 2014-06-09 DIAGNOSIS — L408 Other psoriasis: Secondary | ICD-10-CM | POA: Diagnosis not present

## 2014-06-09 DIAGNOSIS — Z85828 Personal history of other malignant neoplasm of skin: Secondary | ICD-10-CM | POA: Diagnosis not present

## 2014-06-27 DIAGNOSIS — M25819 Other specified joint disorders, unspecified shoulder: Secondary | ICD-10-CM | POA: Diagnosis not present

## 2014-06-27 DIAGNOSIS — M25519 Pain in unspecified shoulder: Secondary | ICD-10-CM | POA: Diagnosis not present

## 2014-06-29 ENCOUNTER — Other Ambulatory Visit: Payer: Self-pay

## 2014-06-29 DIAGNOSIS — Z1231 Encounter for screening mammogram for malignant neoplasm of breast: Secondary | ICD-10-CM

## 2014-07-20 ENCOUNTER — Ambulatory Visit
Admission: RE | Admit: 2014-07-20 | Discharge: 2014-07-20 | Disposition: A | Discharge status: Home / Self Care | Payer: BC Managed Care – PPO | Source: Ambulatory Visit

## 2014-07-20 DIAGNOSIS — Z1231 Encounter for screening mammogram for malignant neoplasm of breast: Secondary | ICD-10-CM

## 2014-08-17 DIAGNOSIS — L723 Sebaceous cyst: Secondary | ICD-10-CM | POA: Diagnosis not present

## 2014-08-17 DIAGNOSIS — L570 Actinic keratosis: Secondary | ICD-10-CM | POA: Diagnosis not present

## 2014-08-17 DIAGNOSIS — L719 Rosacea, unspecified: Secondary | ICD-10-CM | POA: Diagnosis not present

## 2014-08-17 DIAGNOSIS — L408 Other psoriasis: Secondary | ICD-10-CM | POA: Diagnosis not present

## 2014-08-17 DIAGNOSIS — Z85828 Personal history of other malignant neoplasm of skin: Secondary | ICD-10-CM | POA: Diagnosis not present

## 2014-08-17 DIAGNOSIS — Z79899 Other long term (current) drug therapy: Secondary | ICD-10-CM | POA: Diagnosis not present

## 2014-08-25 DIAGNOSIS — K219 Gastro-esophageal reflux disease without esophagitis: Secondary | ICD-10-CM | POA: Diagnosis not present

## 2014-08-25 DIAGNOSIS — Z23 Encounter for immunization: Secondary | ICD-10-CM | POA: Diagnosis not present

## 2014-09-07 DIAGNOSIS — H349 Unspecified retinal vascular occlusion: Secondary | ICD-10-CM | POA: Diagnosis not present

## 2014-09-07 DIAGNOSIS — H2510 Age-related nuclear cataract, unspecified eye: Secondary | ICD-10-CM | POA: Diagnosis not present

## 2014-09-07 DIAGNOSIS — H43819 Vitreous degeneration, unspecified eye: Secondary | ICD-10-CM | POA: Diagnosis not present

## 2014-09-08 DIAGNOSIS — Z23 Encounter for immunization: Secondary | ICD-10-CM | POA: Diagnosis not present

## 2014-09-08 DIAGNOSIS — K219 Gastro-esophageal reflux disease without esophagitis: Secondary | ICD-10-CM | POA: Diagnosis not present

## 2014-10-19 DIAGNOSIS — Z85828 Personal history of other malignant neoplasm of skin: Secondary | ICD-10-CM | POA: Diagnosis not present

## 2014-10-19 DIAGNOSIS — L821 Other seborrheic keratosis: Secondary | ICD-10-CM | POA: Diagnosis not present

## 2014-10-19 DIAGNOSIS — L718 Other rosacea: Secondary | ICD-10-CM | POA: Diagnosis not present

## 2014-10-19 DIAGNOSIS — H2512 Age-related nuclear cataract, left eye: Secondary | ICD-10-CM | POA: Diagnosis not present

## 2014-10-19 DIAGNOSIS — L720 Epidermal cyst: Secondary | ICD-10-CM | POA: Diagnosis not present

## 2014-10-19 DIAGNOSIS — I788 Other diseases of capillaries: Secondary | ICD-10-CM | POA: Diagnosis not present

## 2014-10-31 DIAGNOSIS — H268 Other specified cataract: Secondary | ICD-10-CM | POA: Diagnosis not present

## 2014-10-31 DIAGNOSIS — H25812 Combined forms of age-related cataract, left eye: Secondary | ICD-10-CM | POA: Diagnosis not present

## 2014-10-31 DIAGNOSIS — H2512 Age-related nuclear cataract, left eye: Secondary | ICD-10-CM | POA: Diagnosis not present

## 2015-06-27 ENCOUNTER — Other Ambulatory Visit: Payer: Self-pay

## 2015-06-27 DIAGNOSIS — Z1231 Encounter for screening mammogram for malignant neoplasm of breast: Secondary | ICD-10-CM

## 2015-07-24 ENCOUNTER — Ambulatory Visit
Admission: RE | Admit: 2015-07-24 | Discharge: 2015-07-24 | Disposition: A | Discharge status: Home / Self Care | Payer: Medicare Other | Source: Ambulatory Visit

## 2015-07-24 DIAGNOSIS — Z1231 Encounter for screening mammogram for malignant neoplasm of breast: Secondary | ICD-10-CM

## 2015-07-25 ENCOUNTER — Other Ambulatory Visit: Payer: Self-pay | Admitting: Internal Medicine

## 2015-07-25 DIAGNOSIS — R928 Other abnormal and inconclusive findings on diagnostic imaging of breast: Secondary | ICD-10-CM

## 2015-07-31 ENCOUNTER — Other Ambulatory Visit: Payer: Medicare Other

## 2015-08-03 ENCOUNTER — Ambulatory Visit
Admission: RE | Admit: 2015-08-03 | Discharge: 2015-08-03 | Disposition: A | Discharge status: Home / Self Care | Payer: Medicare Other | Source: Ambulatory Visit | Attending: Internal Medicine | Admitting: Internal Medicine

## 2015-08-03 DIAGNOSIS — R928 Other abnormal and inconclusive findings on diagnostic imaging of breast: Secondary | ICD-10-CM

## 2016-07-01 ENCOUNTER — Other Ambulatory Visit: Payer: Self-pay | Admitting: Internal Medicine

## 2016-07-01 DIAGNOSIS — Z1231 Encounter for screening mammogram for malignant neoplasm of breast: Secondary | ICD-10-CM

## 2016-08-05 ENCOUNTER — Ambulatory Visit
Admission: RE | Admit: 2016-08-05 | Discharge: 2016-08-05 | Disposition: A | Discharge status: Home / Self Care | Payer: Medicare Other | Source: Ambulatory Visit | Attending: Internal Medicine | Admitting: Internal Medicine

## 2016-08-05 ENCOUNTER — Other Ambulatory Visit: Payer: Self-pay | Admitting: Internal Medicine

## 2016-08-05 DIAGNOSIS — Z1231 Encounter for screening mammogram for malignant neoplasm of breast: Secondary | ICD-10-CM

## 2016-08-05 DIAGNOSIS — M546 Pain in thoracic spine: Secondary | ICD-10-CM

## 2017-04-21 ENCOUNTER — Ambulatory Visit (INDEPENDENT_AMBULATORY_CARE_PROVIDER_SITE_OTHER): Payer: Medicare Other | Admitting: Podiatry

## 2017-04-21 ENCOUNTER — Encounter: Payer: Self-pay | Admitting: Podiatry

## 2017-04-21 VITALS — BP 155/71 | HR 55 | Resp 16 | Ht 60.0 in | Wt 138.0 lb

## 2017-04-21 DIAGNOSIS — L600 Ingrowing nail: Secondary | ICD-10-CM

## 2017-04-21 DIAGNOSIS — M21621 Bunionette of right foot: Secondary | ICD-10-CM

## 2017-04-21 NOTE — Progress Notes (Signed)
   Subjective:    Patient ID: Christina Levy, female    DOB: [birth year], 83 y.o.   MRN: [id number]  HPI Chief Complaint  Patient presents with  . Blisters    Bilateral; Left foot-heel-medial side; great toe-medial side; Right foot-medial side-below great toe; pt stated, "Used peppermint cream on blisters and that has helped"; Since Christmas 2017      Review of Systems  Musculoskeletal: Positive for back pain.  Hematological: Bruises/bleeds easily.  All other systems reviewed and are negative.      Objective:   Physical Exam        Assessment & Plan:

## 2017-04-21 NOTE — Patient Instructions (Addendum)

## 2017-04-23 NOTE — Progress Notes (Signed)
Subjective:    Patient ID: Christina Levy, female   DOB: 83 y.o.   MRN: [id number]   HPI patient presents stating I know I got this bunion on the outside of my right foot that get sore and I also have a painful ingrown toenail that I have not been able to cut. Patient states she's tried to soak and other modalities without relief and does have some generalized foot pain    Review of Systems  All other systems reviewed and are negative.       Objective:  Physical Exam  Cardiovascular: Intact distal pulses.   Musculoskeletal: Normal range of motion.  Neurological: She is alert.  Skin: Skin is warm.  Nursing note and vitals reviewed.  neurovascular status intact muscle strength adequate range of motion within normal limits with patient noted to have incurvated nail bed right hallux lateral border that's painful when pressed and enlargement of the fifth metatarsal head right with slight blisters which of healed bilateral. Found to have good digital perfusion well oriented 3     Assessment:    Inflammatory changes of both feet with blister formation ingrown toenail deformity and Taylor's bunion deformity     Plan:    H&P and all conditions reviewed with patient. At this point I went ahead and I've recommended correction of the ingrown toenail and wider shoes with soft leather. I explained procedure and risk and today I infiltrated the hallux 60 mg I can Marcaine mixture remove the corner exposed matrix and applied phenol 3 applications 30 seconds followed by alcohol lavaged sterile dressing. I then discussed other problems we will see how this does  X-rays indicate there is moderate arthritis with tailor bunion deformity right with elevation of the intermetatarsal angle between the fourth and fifth metatarsals

## 2017-05-14 ENCOUNTER — Ambulatory Visit: Payer: Medicare Other | Admitting: Podiatry

## 2017-06-30 ENCOUNTER — Other Ambulatory Visit: Payer: Self-pay | Admitting: Internal Medicine

## 2017-06-30 DIAGNOSIS — Z1231 Encounter for screening mammogram for malignant neoplasm of breast: Secondary | ICD-10-CM

## 2017-09-15 ENCOUNTER — Ambulatory Visit
Admission: RE | Admit: 2017-09-15 | Discharge: 2017-09-15 | Disposition: A | Discharge status: Home / Self Care | Payer: Medicare Other | Source: Ambulatory Visit | Attending: Internal Medicine | Admitting: Internal Medicine

## 2017-09-15 DIAGNOSIS — Z1231 Encounter for screening mammogram for malignant neoplasm of breast: Secondary | ICD-10-CM
